# Patient Record
Sex: Male | Born: 1975 | Race: Black or African American | Hispanic: No | Marital: Married | Smoking: Never smoker
Health system: Southern US, Community
[De-identification: ages and names within clinical notes are randomized; demographics above are authoritative.]

## PROBLEM LIST (undated history)

## (undated) DIAGNOSIS — K648 Other hemorrhoids: Secondary | ICD-10-CM

## (undated) DIAGNOSIS — Z789 Other specified health status: Secondary | ICD-10-CM

## (undated) HISTORY — DX: Other hemorrhoids: K64.8

## (undated) HISTORY — PX: OTHER SURGICAL HISTORY: SHX169

## (undated) HISTORY — PX: HEMORRHOID BANDING: SHX5850

## (undated) HISTORY — PX: NO PAST SURGERIES: SHX2092

---

## 2004-05-15 ENCOUNTER — Ambulatory Visit (HOSPITAL_COMMUNITY): Admission: RE | Admit: 2004-05-15 | Discharge: 2004-05-15 | Payer: Self-pay | Admitting: Surgery

## 2008-03-08 ENCOUNTER — Emergency Department (HOSPITAL_COMMUNITY): Admission: EM | Admit: 2008-03-08 | Discharge: 2008-03-08 | Payer: Self-pay | Admitting: Emergency Medicine

## 2009-04-08 ENCOUNTER — Emergency Department (HOSPITAL_COMMUNITY): Admission: EM | Admit: 2009-04-08 | Discharge: 2009-04-08 | Payer: Self-pay | Admitting: Emergency Medicine

## 2010-01-31 ENCOUNTER — Emergency Department (HOSPITAL_COMMUNITY): Admission: EM | Admit: 2010-01-31 | Discharge: 2010-01-31 | Payer: Self-pay | Admitting: Family Medicine

## 2011-02-28 LAB — POCT RAPID STREP A (OFFICE): Streptococcus, Group A Screen (Direct): NEGATIVE

## 2011-03-18 LAB — BLOOD GAS, ARTERIAL
Acid-base deficit: 2.1 mmol/L — ABNORMAL HIGH (ref 0.0–2.0)
Patient temperature: 98.6
TCO2: 23.5 mmol/L (ref 0–100)

## 2011-03-18 LAB — DIFFERENTIAL
Basophils Absolute: 0 10*3/uL (ref 0.0–0.1)
Basophils Relative: 1 % (ref 0–1)
Eosinophils Absolute: 0 10*3/uL (ref 0.0–0.7)
Lymphocytes Relative: 22 % (ref 12–46)
Monocytes Absolute: 0.2 10*3/uL (ref 0.1–1.0)

## 2011-03-18 LAB — RAPID URINE DRUG SCREEN, HOSP PERFORMED
Amphetamines: NOT DETECTED
Barbiturates: NOT DETECTED
Benzodiazepines: NOT DETECTED
Cocaine: NOT DETECTED
Opiates: NOT DETECTED

## 2011-03-18 LAB — URINALYSIS, ROUTINE W REFLEX MICROSCOPIC
Hgb urine dipstick: NEGATIVE
Protein, ur: NEGATIVE mg/dL
Specific Gravity, Urine: 1.004 — ABNORMAL LOW (ref 1.005–1.030)
pH: 5.5 (ref 5.0–8.0)

## 2011-03-18 LAB — COMPREHENSIVE METABOLIC PANEL
Alkaline Phosphatase: 58 U/L (ref 39–117)
CO2: 22 mEq/L (ref 19–32)
Chloride: 107 mEq/L (ref 96–112)
GFR calc non Af Amer: >60 mL/min (ref >60)
Potassium: 3.4 mEq/L — ABNORMAL LOW (ref 3.5–5.1)
Total Protein: 6.3 g/dL (ref 6.0–8.3)

## 2011-03-18 LAB — CBC
MCHC: 34.2 g/dL (ref 30.0–36.0)
MCV: 89 fL (ref 78.0–100.0)
RBC: 4.4 MIL/uL (ref 4.22–5.81)
RDW: 12.4 % (ref 11.5–15.5)

## 2011-03-18 LAB — POCT CARDIAC MARKERS: Troponin i, poc: <0.05 ng/mL (ref 0.00–0.09)

## 2011-03-18 LAB — ETHANOL: Alcohol, Ethyl (B): 338 mg/dL — ABNORMAL HIGH (ref 0–10)

## 2011-04-22 NOTE — Discharge Summary (Signed)
NAME:  Phillip [name], Phillip [name]                ACCOUNT NO.:  [id number]   MEDICAL RECORD NO.:  [id number]          PATIENT TYPE:  EMS   LOCATION:  ED                           FACILITY:  [facility]   PHYSICIAN:  Pedro Earls, MD     DATE OF BIRTH:  Sep 12, 1976   DATE OF ADMISSION:  04/08/2009  DATE OF DISCHARGE:  04/08/2009                               DISCHARGE SUMMARY   DISCHARGE DIAGNOSES:  1. Unresponsiveness secondary to alcohol intoxication.  2. Atelectasis.   HOSPITAL COURSE:  Please see my H and P dictated approximately 1 hour  ago for details.  The patient apparently improved in the emergency room.  He is fully awake and wants to go home.  The patient's condition  improved.  The patient is stable to be discharged.   DISCHARGE MEDICATIONS:  None.      Pedro Earls, MD  Electronically Signed     NS/MEDQ  D:  04/08/2009  T:  04/08/2009  Job:  [id number]
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
## 2015-11-02 ENCOUNTER — Emergency Department (HOSPITAL_BASED_OUTPATIENT_CLINIC_OR_DEPARTMENT_OTHER)
Admission: EM | Admit: 2015-11-02 | Discharge: 2015-11-02 | Disposition: A | Discharge status: Home / Self Care | Payer: BC Managed Care – PPO | Attending: Emergency Medicine | Admitting: Emergency Medicine

## 2015-11-02 ENCOUNTER — Emergency Department (HOSPITAL_BASED_OUTPATIENT_CLINIC_OR_DEPARTMENT_OTHER): Payer: BC Managed Care – PPO

## 2015-11-02 ENCOUNTER — Encounter (HOSPITAL_BASED_OUTPATIENT_CLINIC_OR_DEPARTMENT_OTHER): Payer: Self-pay | Admitting: Emergency Medicine

## 2015-11-02 DIAGNOSIS — K2289 Other specified disease of esophagus: Secondary | ICD-10-CM

## 2015-11-02 DIAGNOSIS — R079 Chest pain, unspecified: Secondary | ICD-10-CM | POA: Diagnosis present

## 2015-11-02 DIAGNOSIS — K228 Other specified diseases of esophagus: Secondary | ICD-10-CM | POA: Diagnosis not present

## 2015-11-02 LAB — TROPONIN I

## 2015-11-02 MED ORDER — OMEPRAZOLE 40 MG PO CPDR: DELAYED_RELEASE_CAPSULE | ORAL | Status: DC

## 2015-11-02 MED ORDER — SUCRALFATE 1 G PO TABS: 1.0000 g | ORAL_TABLET | Freq: Three times a day (TID) | ORAL | Status: DC

## 2015-11-02 MED ORDER — SUCRALFATE 1 G PO TABS
1.0000 g | ORAL_TABLET | Freq: Once | ORAL | Status: AC
  Administered 2015-11-02: 1 g via ORAL
  Filled 2015-11-02: qty 1

## 2015-11-02 NOTE — ED Notes (Signed)
Pt c/o substernal chest pain x 4 days. Pt states pain worsens after eating and drinking. Pain is described as sharp in nature.

## 2015-11-02 NOTE — ED Notes (Signed)
Pt states he had tried several OTC "heart burn" medications without relief.

## 2015-11-02 NOTE — ED Provider Notes (Signed)
CSN: [id number]     Arrival date & time 11/02/15  0127 History   First MD Initiated Contact with Patient 11/02/15 0209     Chief Complaint  Patient presents with  . Chest Pain     (Consider location/radiation/quality/duration/timing/severity/associated sxs/prior Treatment) HPI  This is a 39 year old male with a five-day history of chest pain. The pain is located just to the left of his lower sternum. It was sharp and well localized. The pain comes and goes, but is exacerbated whenever he eats or drinks anything. Symptoms are moderate. He has an associated feeling of being bloated. He denies nausea, vomiting, diarrhea, shortness of breath or change with breathing. He has tried multiple over-the-counter medications including Zantac, Pepcid, Nexium and Prilosec without relief.  History reviewed. No pertinent past medical history. History reviewed. No pertinent past surgical history. No family history on file. Social History  Substance Use Topics  . Smoking status: Never Smoker   . Smokeless tobacco: None  . Alcohol Use: Yes    Review of Systems  All other systems reviewed and are negative.   Allergies  Review of patient's allergies indicates no known allergies.  Home Medications   Prior to Admission medications   Not on File   BP 152/88 mmHg  Pulse 58  Temp(Src) 98.7 F (37.1 C) (Oral)  Resp 16  Ht 5\' 7"  (1.702 m)  Wt 175 lb (79.379 kg)  BMI 27.40 kg/m2  SpO2 100%   Physical Exam  General: Well-developed, well-nourished male in no acute distress; appearance consistent with age of record HENT: normocephalic; atraumatic Eyes: pupils equal, round and reactive to light; extraocular muscles intact Neck: supple Heart: regular rate and rhythm Lungs: clear to auscultation bilaterally Chest: Nontender Abdomen: soft; nondistended; nontender; no masses or hepatosplenomegaly; bowel sounds present Extremities: No deformity; full range of motion; pulses normal Neurologic:  Awake, alert and oriented; motor function intact in all extremities and symmetric; no facial droop Skin: Warm and dry Psychiatric: Normal mood and affect    ED Course  Procedures (including critical care time)   EKG Interpretation   Date/Time:  Friday November 02 2015 01:41:25 EST Ventricular Rate:  72 PR Interval:  170 QRS Duration: 82 QT Interval:  368 QTC Calculation: 402 R Axis:   50 Text Interpretation:  Normal sinus rhythm Normal ECG No significant change  was found Confirmed by Florina Ou  MD, Jenny Reichmann ([id number]) on 11/02/2015 2:06:27 AM      MDM  Nursing notes and vitals signs, including pulse oximetry, reviewed.  Summary of this visit's results, reviewed by myself:  Labs:  Results for orders placed or performed during the hospital encounter of 11/02/15 (from the past 24 hour(s))  Troponin I     Status: None   Collection Time: 11/02/15  2:20 AM  Result Value Ref Range   Troponin I <0.03 <0.031 ng/mL    Imaging Studies: Dg Chest 2 View  11/02/2015  CLINICAL DATA:  Acute onset of mid to left-sided chest pain. Initial encounter. EXAM: CHEST  2 VIEW COMPARISON:  Chest radiograph performed 04/08/2009 FINDINGS: The lungs are well-aerated. Mild vascular congestion is noted. There is no evidence of focal opacification, pleural effusion or pneumothorax. The heart is normal in size; the mediastinal contour is within normal limits. No acute osseous abnormalities are seen. IMPRESSION: Mild vascular congestion noted.  Lungs remain grossly clear. Electronically Signed   By: Garald Balding M.D.   On: 11/02/2015 03:12   3:21 AM Suspect esophageal spasm or irritation given clear  exacerbation with eating or drinking. His EKG is normal as is his troponin. There is no hiatal hernia seen on his chest x-ray. We will place him on a PPI and Carafate and refer to gastroenterology.    Shanon Rosser, MD 11/02/15 [phone]

## 2018-01-09 ENCOUNTER — Encounter (HOSPITAL_COMMUNITY): Payer: Self-pay | Admitting: Emergency Medicine

## 2018-01-09 ENCOUNTER — Other Ambulatory Visit: Payer: Self-pay

## 2018-01-09 ENCOUNTER — Ambulatory Visit (HOSPITAL_COMMUNITY)
Admission: EM | Admit: 2018-01-09 | Discharge: 2018-01-09 | Disposition: A | Discharge status: Home / Self Care | Payer: BC Managed Care – PPO | Attending: Family Medicine | Admitting: Family Medicine

## 2018-01-09 ENCOUNTER — Ambulatory Visit (INDEPENDENT_AMBULATORY_CARE_PROVIDER_SITE_OTHER): Payer: BC Managed Care – PPO

## 2018-01-09 DIAGNOSIS — S82142A Displaced bicondylar fracture of left tibia, initial encounter for closed fracture: Secondary | ICD-10-CM | POA: Diagnosis not present

## 2018-01-09 DIAGNOSIS — S82102A Unspecified fracture of upper end of left tibia, initial encounter for closed fracture: Secondary | ICD-10-CM
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
## 2018-01-09 NOTE — Discharge Instructions (Signed)
You may use over the counter ibuprofen or acetaminophen as needed.   Use crutches to remain non weight bearing until you see an orthopaedist.

## 2018-01-09 NOTE — ED Provider Notes (Signed)
  [facility]   [id number] 01/09/18 Arrival Time: 3976  ASSESSMENT & PLAN:  1. Tibial plateau fracture, left, closed, initial encounter    Imaging: Dg Knee Complete 4 Views Left  Result Date: 01/09/2018 CLINICAL DATA:  Left knee pain following direct trauma yesterday. EXAM: LEFT KNEE - COMPLETE 4+ VIEW COMPARISON:  None. FINDINGS: There is an avulsion fracture off the lateral aspect of the lateral tibial plateau. No intra-articular involvement. There is an associated moderate-sized effusion and diffuse subcutaneous edema. IMPRESSION: 1. Avulsion fracture off the lateral aspect of the lateral tibial plateau. 2. Moderate-sized effusion. Electronically Signed   By: Claudie Revering M.D.   On: 01/09/2018 15:13   Crutches to remain NWB until he sees an orthopaedist. We do not have a hinged knee brace to give him. OTC analgesics as needed. Work note given. May f/u here as needed.  Reviewed expectations re: course of current medical issues. Questions answered. Outlined signs and symptoms indicating need for more acute intervention. Patient verbalized understanding. After Visit Summary given.  SUBJECTIVE: History from: patient. Phillip [name] is a 42 y.o. male who reports persistent pain of his left lateral knee with swelling. Onset abrupt beginning 1 day ago. Injury/trama: yes, reports being kicked in L knee while playing basketball. Discomfort described as aching and stabbing without radiation. Extremity sensation changes or weakness: none. Self treatment: ibuprofen with mild help. Bending knee worsens discomfort. No locking or giving out. Able to bear weight but with pain.  ROS: As per HPI.   OBJECTIVE:  Vitals:   01/09/18 1421  BP: 135/74  Pulse: 77  Resp: 18  Temp: 98.2 F (36.8 C)  SpO2: 100%    General appearance: alert; no distress Extremities: no cyanosis or edema; symmetrical with no gross deformities; tenderness over his left lateral knee with moderate swelling and  no bruising; ROM: normal but with report pain CV: normal extremity capillary refill Skin: warm and dry Neurologic: normal gait; normal symmetric reflexes in all extremities; normal sensation in all extremities Psychological: alert and cooperative; normal mood and affect  No Known Allergies   Social History   Socioeconomic History  . Marital status: Married    Spouse name: Not on file  . Number of children: Not on file  . Years of education: Not on file  . Highest education level: Not on file  Social Needs  . Financial resource strain: Not on file  . Food insecurity - worry: Not on file  . Food insecurity - inability: Not on file  . Transportation needs - medical: Not on file  . Transportation needs - non-medical: Not on file  Occupational History  . Not on file  Tobacco Use  . Smoking status: Never Smoker  Substance and Sexual Activity  . Alcohol use: Yes  . Drug use: Not on file  . Sexual activity: Not on file  Other Topics Concern  . Not on file  Social History Narrative  . Not on file     Vanessa Kick, MD 01/11/18 1004

## 2018-01-11 ENCOUNTER — Encounter (INDEPENDENT_AMBULATORY_CARE_PROVIDER_SITE_OTHER): Payer: Self-pay | Admitting: Orthopaedic Surgery

## 2018-01-11 ENCOUNTER — Ambulatory Visit (INDEPENDENT_AMBULATORY_CARE_PROVIDER_SITE_OTHER): Payer: BC Managed Care – PPO | Admitting: Orthopaedic Surgery

## 2018-01-11 DIAGNOSIS — S82142A Displaced bicondylar fracture of left tibia, initial encounter for closed fracture: Secondary | ICD-10-CM | POA: Diagnosis not present
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
## 2018-01-22 ENCOUNTER — Ambulatory Visit
Admission: RE | Admit: 2018-01-22 | Discharge: 2018-01-22 | Disposition: A | Discharge status: Home / Self Care | Payer: BC Managed Care – PPO | Source: Ambulatory Visit | Attending: Orthopaedic Surgery | Admitting: Orthopaedic Surgery

## 2018-01-22 DIAGNOSIS — S82142A Displaced bicondylar fracture of left tibia, initial encounter for closed fracture: Secondary | ICD-10-CM

## 2018-01-26 ENCOUNTER — Telehealth (INDEPENDENT_AMBULATORY_CARE_PROVIDER_SITE_OTHER): Payer: Self-pay | Admitting: Physician Assistant

## 2018-01-26 ENCOUNTER — Ambulatory Visit (INDEPENDENT_AMBULATORY_CARE_PROVIDER_SITE_OTHER): Payer: BC Managed Care – PPO | Admitting: Physician Assistant

## 2018-01-26 ENCOUNTER — Encounter (INDEPENDENT_AMBULATORY_CARE_PROVIDER_SITE_OTHER): Payer: Self-pay | Admitting: Physician Assistant

## 2018-01-26 DIAGNOSIS — S83242A Other tear of medial meniscus, current injury, left knee, initial encounter: Secondary | ICD-10-CM | POA: Insufficient documentation

## 2018-01-26 DIAGNOSIS — S83512A Sprain of anterior cruciate ligament of left knee, initial encounter: Secondary | ICD-10-CM | POA: Insufficient documentation

## 2018-01-26 DIAGNOSIS — S82142D Displaced bicondylar fracture of left tibia, subsequent encounter for closed fracture with routine healing: Secondary | ICD-10-CM

## 2018-01-26 DIAGNOSIS — S83242D Other tear of medial meniscus, current injury, left knee, subsequent encounter: Secondary | ICD-10-CM | POA: Diagnosis not present

## 2018-01-26 DIAGNOSIS — S83512D Sprain of anterior cruciate ligament of left knee, subsequent encounter: Secondary | ICD-10-CM | POA: Diagnosis not present

## 2018-01-26 NOTE — Telephone Encounter (Signed)
Will you look at this guys MRI?  I saw him today.  Will keep him wbat in knee immobilizer and refer to Dr. Marlou Sa.  Want to make sure you are ok with all of this?

## 2018-01-26 NOTE — Progress Notes (Signed)
Phillip [name] comes in for follow-up.  13 days out from left knee injury, date of injury 01/09/2018.  We saw him in our clinic on 01/11/2018 and treated him for a Segond fracture to the left knee.  At that point, it was noticeable that he was ACL deficient.  an MRI was ordered.  He has been weightbearing as tolerated in a knee immobilizer.  Minimal pain, just some discomfort from wearing the knee immobilizer.  He comes in today to discuss MRI results of the left knee.  MRI from 01/23/2018 reveals a complete tear of the ACL with a complex tear of the medial meniscus.  Also noted is a grade 1 MCL injury, a small avulsion fracture along the lateral aspect of the lateral tibial plateau (segond fracture), as well as a nondisplaced impaction fracture medial tibial plateau.  At this point we are going to have Phillip [name] continue in his knee immobilizer.  We we will refer him to Dr. Marlou Sa for further care which will include ACL reconstruction.  He will call with concerns or questions in the meantime.

## 2018-01-27 NOTE — Telephone Encounter (Signed)
Yes - I did    Thanks

## 2018-01-27 NOTE — Telephone Encounter (Signed)
Ok, thanks.

## 2018-01-29 ENCOUNTER — Ambulatory Visit (INDEPENDENT_AMBULATORY_CARE_PROVIDER_SITE_OTHER): Payer: BC Managed Care – PPO | Admitting: Orthopaedic Surgery

## 2018-02-02 ENCOUNTER — Telehealth (INDEPENDENT_AMBULATORY_CARE_PROVIDER_SITE_OTHER): Payer: Self-pay | Admitting: Orthopedic Surgery

## 2018-02-02 NOTE — Telephone Encounter (Signed)
Tried calling. No answer. VM is full. Will try to call again later. Ok to schedule earlier.

## 2018-02-02 NOTE — Telephone Encounter (Signed)
Patient called stated that he has several fractures and not sure if they have to heal before he has surgery.  Please call patient to advise

## 2018-02-02 NOTE — Telephone Encounter (Signed)
Fxs ok I reviewed scan - shouldn't delay surgery - ok to come in earlier if he desires - might be good to do to get him on schedule earlier

## 2018-02-02 NOTE — Telephone Encounter (Signed)
Patient has an appointment with you on 02/08/18. Dr Erlinda Hong referred. Please advise on question that he has about the fractures and his injury. Do you want me to get him in with you sooner?

## 2018-02-03 NOTE — Telephone Encounter (Signed)
Patient wanted to wait until he can speak with you first about scheduling appt due to the high copay, best number to reach him at is the mobile number. He will clear off the voicemail to be sure you can reach him. # [phone]

## 2018-02-03 NOTE — Telephone Encounter (Signed)
Tried calling again, no answer

## 2018-02-03 NOTE — Telephone Encounter (Signed)
Patient referred to you by Dr Erlinda Hong. He states that he has a $100 copay and cannot afford this just to come in to talk to you for surgical consult. Please advise. Thanks.

## 2018-02-04 NOTE — Telephone Encounter (Signed)
I called Phillip [name].  He is doing okay.  Does not have the leg fully straight yet and cannot bend it past 90 degrees.  In order to minimize cost I recommended that he work on achieving better range of motion including full extension and flexion past 90 degrees before we consider surgery.  Discussed with him about ACL reconstruction and partial medial meniscectomy.  Plan for him to call me in 3-4 weeks so we can talk about how his knee range of motion is going.  Because he wants to minimize cost I discussed with him exercises to work on achieving full extension primarily.

## 2018-02-08 ENCOUNTER — Ambulatory Visit (INDEPENDENT_AMBULATORY_CARE_PROVIDER_SITE_OTHER): Payer: BC Managed Care – PPO | Admitting: Orthopedic Surgery

## 2018-03-01 ENCOUNTER — Ambulatory Visit (INDEPENDENT_AMBULATORY_CARE_PROVIDER_SITE_OTHER): Payer: BC Managed Care – PPO | Admitting: Orthopedic Surgery

## 2018-03-05 ENCOUNTER — Encounter (INDEPENDENT_AMBULATORY_CARE_PROVIDER_SITE_OTHER): Payer: Self-pay | Admitting: Orthopedic Surgery

## 2018-03-05 ENCOUNTER — Ambulatory Visit (INDEPENDENT_AMBULATORY_CARE_PROVIDER_SITE_OTHER): Payer: BC Managed Care – PPO | Admitting: Orthopedic Surgery

## 2018-03-05 DIAGNOSIS — S83512D Sprain of anterior cruciate ligament of left knee, subsequent encounter: Secondary | ICD-10-CM

## 2018-03-06 ENCOUNTER — Encounter (INDEPENDENT_AMBULATORY_CARE_PROVIDER_SITE_OTHER): Payer: Self-pay | Admitting: Orthopedic Surgery

## 2018-03-06 NOTE — Progress Notes (Signed)
   Post-Op Visit Note   Patient: Phillip [name]           Date of Birth: 1976-10-07           MRN: [id number] Visit Date: 03/05/2018 PCP: Patient, No Pcp Per   Assessment & Plan:  Chief Complaint:  Chief Complaint  Patient presents with  . Left Knee - Pain, Follow-up   Visit Diagnoses:  1. Rupture of anterior cruciate ligament of left knee, subsequent encounter     Plan: Phillip [name] is a patient who injured his left knee 2-19.  He states that his knee is feeling a little better.  Has some difficulty bending past 90 as well as with stooping and squatting.  He does report occasional symptomatic instability.  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  He likes to play basketball and does running and baseball.  No personal or family history of DVT or pulmonary embolism.  He is married.  Does have stairs at home.  MRI scan is reviewed and it shows a small Segond fracture off the tibial plateau as well as ACL tear and medial meniscal tear.  On examination the patient lacks about 5 degrees of full extension on the left knee.  Does have instability to Lachman and anterior drawer.  MCL and LCL feel stable.  No posterior lateral rotatory instability and side to side difference testing left versus right.  No other masses lymphadenopathy or skin changes noted in that left tibial plateau region pedal pulses palpable and ankle dorsiflexion intact..  Impression is left ACL tear and medial meniscal tear without any evidence of lateral sided ligamentous instability.  Plan is ACL reconstruction with hamstring autograft and medial meniscal resection and/or repair.  Risk benefits are discussed with the patient including but not limited to infection nerve vessel damage knee stiffness prolonged rehabilitation.  All questions answered.  Follow-Up Instructions: No follow-ups on file.   Orders:  No orders of the defined types were placed in this encounter.  No orders of the defined types were placed in  this encounter.   Imaging: No results found.  PMFS History: Patient Active Problem List   Diagnosis Date Noted  . Left anterior cruciate ligament tear 01/26/2018  . Tear of medial meniscus of left knee, current 01/26/2018  . Closed fracture of left tibial plateau 01/11/2018   History reviewed. No pertinent past medical history.  History reviewed. No pertinent family history.  History reviewed. No pertinent surgical history. Social History   Occupational History  . Not on file  Tobacco Use  . Smoking status: Never Smoker  . Smokeless tobacco: Never Used  Substance and Sexual Activity  . Alcohol use: Yes  . Drug use: Not on file  . Sexual activity: Not on file

## 2018-03-12 ENCOUNTER — Other Ambulatory Visit (INDEPENDENT_AMBULATORY_CARE_PROVIDER_SITE_OTHER): Payer: Self-pay | Admitting: Orthopedic Surgery

## 2018-03-12 DIAGNOSIS — S83512A Sprain of anterior cruciate ligament of left knee, initial encounter: Principal | ICD-10-CM

## 2018-03-12 DIAGNOSIS — S83207A Unspecified tear of unspecified meniscus, current injury, left knee, initial encounter: Secondary | ICD-10-CM

## 2018-03-19 ENCOUNTER — Encounter (HOSPITAL_COMMUNITY)
Admission: RE | Admit: 2018-03-19 | Discharge: 2018-03-19 | Disposition: A | Discharge status: Home / Self Care | Payer: BC Managed Care – PPO | Source: Ambulatory Visit | Attending: Orthopedic Surgery | Admitting: Orthopedic Surgery

## 2018-03-19 ENCOUNTER — Encounter (HOSPITAL_COMMUNITY): Payer: Self-pay

## 2018-03-19 DIAGNOSIS — X58XXXA Exposure to other specified factors, initial encounter: Secondary | ICD-10-CM | POA: Insufficient documentation

## 2018-03-19 DIAGNOSIS — S83242A Other tear of medial meniscus, current injury, left knee, initial encounter: Secondary | ICD-10-CM | POA: Insufficient documentation

## 2018-03-19 DIAGNOSIS — Z01812 Encounter for preprocedural laboratory examination: Secondary | ICD-10-CM | POA: Insufficient documentation

## 2018-03-19 DIAGNOSIS — S83512A Sprain of anterior cruciate ligament of left knee, initial encounter: Secondary | ICD-10-CM | POA: Diagnosis not present

## 2018-03-19 HISTORY — DX: Other specified health status: Z78.9

## 2018-03-19 LAB — BASIC METABOLIC PANEL
Anion gap: 10 (ref 5–15)
BUN: 14 mg/dL (ref 6–20)
CHLORIDE: 106 mmol/L (ref 101–111)
CO2: 23 mmol/L (ref 22–32)
CREATININE: 1.39 mg/dL — AB (ref 0.61–1.24)
Calcium: 9.3 mg/dL (ref 8.9–10.3)
GFR calc Af Amer: >60 mL/min (ref >60)
GFR calc non Af Amer: >60 mL/min (ref >60)
GLUCOSE: 98 mg/dL (ref 65–99)
Potassium: 4.1 mmol/L (ref 3.5–5.1)
SODIUM: 139 mmol/L (ref 135–145)

## 2018-03-19 LAB — CBC
HCT: 41.6 % (ref 39.0–52.0)
Hemoglobin: 14.2 g/dL (ref 13.0–17.0)
MCH: 29.5 pg (ref 26.0–34.0)
MCHC: 34.1 g/dL (ref 30.0–36.0)
MCV: 86.3 fL (ref 78.0–100.0)
Platelets: 288 10*3/uL (ref 150–400)
RBC: 4.82 MIL/uL (ref 4.22–5.81)
RDW: 12.6 % (ref 11.5–15.5)
WBC: 5.8 10*3/uL (ref 4.0–10.5)

## 2018-03-29 ENCOUNTER — Encounter (HOSPITAL_COMMUNITY): Payer: Self-pay | Admitting: Anesthesiology

## 2018-03-29 MED ORDER — CEFAZOLIN SODIUM-DEXTROSE 2-4 GM/100ML-% IV SOLN
2.0000 g | INTRAVENOUS | Status: AC
  Administered 2018-03-30: 2 g via INTRAVENOUS
  Filled 2018-03-29: qty 100

## 2018-03-29 NOTE — H&P (Signed)
Phillip [name] is an 42 y.o. male.   Chief Complaint: Left knee pain and instability Phillip [name] is a 42 year old patient with left knee pain and instability. HPI: He injured his knee in February 2019.  MRI scan demonstrated ACL tear medial meniscal tear and small Segond fracture.  Subsequent examination demonstrates ACL instability but no collateral ligament instability or posterior lateral rotatory instability.  Patient is very active in sports.  He presents now for operative management after explanation of risks and benefits.  No family history or personal history of DVT or pulmonary embolism.  Past Medical History:  Diagnosis Date  . Medical history non-contributory     Past Surgical History:  Procedure Laterality Date  . NO PAST SURGERIES      No family history on file. Social History:  reports that he has never smoked. He has never used smokeless tobacco. He reports that he drinks alcohol. His drug history is not on file.  Allergies: No Known Allergies  No medications prior to admission.    No results found for this or any previous visit (from the past 48 hour(s)). No results found.  Review of Systems  Musculoskeletal: Positive for joint pain.  All other systems reviewed and are negative.   There were no vitals taken for this visit. Physical Exam  Constitutional: He appears well-developed.  HENT:  Head: Normocephalic.  Eyes: Pupils are equal, round, and reactive to light.  Neck: Normal range of motion.  Cardiovascular: Normal rate.  Respiratory: Effort normal.  Neurological: He is alert.  Skin: Skin is warm.  Psychiatric: He has a normal mood and affect.  Examination of the left knee demonstrates flexion past 90 with near full extension.  ACL laxity is present.  Collateral stable.  No posterior lateral rotatory instability noted.  Pedal pulses palpable.  Ankle dorsiflexion plantarflexion intact.  Assessment/Plan Impression is left knee ACL tear and medial meniscal  tear.  That medial meniscal tear may or may not be repairable.  Looks like it is in the red-white zone with a horizontal cleavage component.  Risks and benefits of surgical intervention are discussed.  They include but are not limited to infection nerve vessel damage knee stiffness as well as incomplete healing and potential for further surgery.  Patient understands the risk and benefits and wishes to proceed.  I think hamstring autograft would give him good fixation and the least amount of donor site morbidity.  [name], MD 03/29/2018, 10:28 AM

## 2018-03-29 NOTE — Anesthesia Preprocedure Evaluation (Addendum)
Anesthesia Evaluation  Patient identified by MRN, date of birth, ID band Patient awake    Reviewed: Allergy & Precautions, NPO status , Patient's Chart, lab work & pertinent test results  Airway Mallampati: II       Dental no notable dental hx. (+) Teeth Intact   Pulmonary neg pulmonary ROS,    Pulmonary exam normal breath sounds clear to auscultation       Cardiovascular negative cardio ROS Normal cardiovascular exam Rhythm:Regular Rate:Normal     Neuro/Psych negative neurological ROS  negative psych ROS   GI/Hepatic negative GI ROS, Neg liver ROS,   Endo/Other  negative endocrine ROS  Renal/GU negative Renal ROS  negative genitourinary   Musculoskeletal negative musculoskeletal ROS (+)   Abdominal Normal abdominal exam  (+)   Peds  Hematology negative hematology ROS (+)   Anesthesia Other Findings   Reproductive/Obstetrics                            Anesthesia Physical Anesthesia Plan  ASA: I  Anesthesia Plan: General   Post-op Pain Management:  Regional for Post-op pain   Induction: Intravenous  PONV Risk Score and Plan: 1 and Ondansetron and Dexamethasone  Airway Management Planned: Oral ETT  Additional Equipment:   Intra-op Plan:   Post-operative Plan: Extubation in OR  Informed Consent: I have reviewed the patients History and Physical, chart, labs and discussed the procedure including the risks, benefits and alternatives for the proposed anesthesia with the patient or authorized representative who has indicated his/her understanding and acceptance.   Dental advisory given  Plan Discussed with: CRNA and Surgeon  Anesthesia Plan Comments:        Anesthesia Quick Evaluation

## 2018-03-30 ENCOUNTER — Encounter (INDEPENDENT_AMBULATORY_CARE_PROVIDER_SITE_OTHER): Payer: Self-pay | Admitting: Orthopedic Surgery

## 2018-03-30 ENCOUNTER — Ambulatory Visit (HOSPITAL_COMMUNITY): Payer: BC Managed Care – PPO | Admitting: Anesthesiology

## 2018-03-30 ENCOUNTER — Telehealth (INDEPENDENT_AMBULATORY_CARE_PROVIDER_SITE_OTHER): Payer: Self-pay | Admitting: Orthopedic Surgery

## 2018-03-30 ENCOUNTER — Ambulatory Visit (HOSPITAL_COMMUNITY)
Admission: RE | Admit: 2018-03-30 | Discharge: 2018-03-30 | Disposition: A | Discharge status: Home / Self Care | Payer: BC Managed Care – PPO | Source: Ambulatory Visit | Attending: Orthopedic Surgery | Admitting: Orthopedic Surgery

## 2018-03-30 ENCOUNTER — Other Ambulatory Visit: Payer: Self-pay

## 2018-03-30 ENCOUNTER — Encounter (HOSPITAL_COMMUNITY): Payer: Self-pay | Admitting: *Deleted

## 2018-03-30 ENCOUNTER — Encounter (HOSPITAL_COMMUNITY)
Admission: RE | Disposition: A | Discharge status: Home / Self Care | Payer: Self-pay | Source: Ambulatory Visit | Attending: Orthopedic Surgery

## 2018-03-30 DIAGNOSIS — S83242A Other tear of medial meniscus, current injury, left knee, initial encounter: Secondary | ICD-10-CM | POA: Insufficient documentation

## 2018-03-30 DIAGNOSIS — X58XXXA Exposure to other specified factors, initial encounter: Secondary | ICD-10-CM | POA: Diagnosis not present

## 2018-03-30 DIAGNOSIS — S83242D Other tear of medial meniscus, current injury, left knee, subsequent encounter: Secondary | ICD-10-CM | POA: Diagnosis not present

## 2018-03-30 DIAGNOSIS — S83512D Sprain of anterior cruciate ligament of left knee, subsequent encounter: Secondary | ICD-10-CM | POA: Diagnosis not present

## 2018-03-30 DIAGNOSIS — S83207A Unspecified tear of unspecified meniscus, current injury, left knee, initial encounter: Secondary | ICD-10-CM

## 2018-03-30 DIAGNOSIS — M2242 Chondromalacia patellae, left knee: Secondary | ICD-10-CM | POA: Insufficient documentation

## 2018-03-30 DIAGNOSIS — S83512A Sprain of anterior cruciate ligament of left knee, initial encounter: Secondary | ICD-10-CM | POA: Diagnosis not present

## 2018-03-30 HISTORY — PX: ANTERIOR CRUCIATE LIGAMENT REPAIR: SHX115

## 2018-03-30 SURGERY — RECONSTRUCTION, KNEE, ACL, USING HAMSTRING GRAFT
Anesthesia: Regional | Site: Knee | Laterality: Left

## 2018-03-30 MED ORDER — 0.9 % SODIUM CHLORIDE (POUR BTL) OPTIME
TOPICAL | Status: DC | PRN
  Administered 2018-03-30: 1000 mL

## 2018-03-30 MED ORDER — ROCURONIUM BROMIDE 100 MG/10ML IV SOLN
INTRAVENOUS | Status: DC | PRN
  Administered 2018-03-30: 50 mg via INTRAVENOUS

## 2018-03-30 MED ORDER — SUGAMMADEX SODIUM 200 MG/2ML IV SOLN
INTRAVENOUS | Status: AC
Start: 2018-03-30 — End: ?
  Filled 2018-03-30: qty 2

## 2018-03-30 MED ORDER — PROPOFOL 10 MG/ML IV BOLUS
INTRAVENOUS | Status: AC
  Filled 2018-03-30: qty 40

## 2018-03-30 MED ORDER — SODIUM CHLORIDE 0.9 % IR SOLN
Status: DC | PRN
  Administered 2018-03-30: 3000 mL

## 2018-03-30 MED ORDER — CHLORHEXIDINE GLUCONATE 4 % EX LIQD: 60.0000 mL | Freq: Once | CUTANEOUS | Status: DC

## 2018-03-30 MED ORDER — LIDOCAINE HCL (CARDIAC) PF 100 MG/5ML IV SOSY
PREFILLED_SYRINGE | INTRAVENOUS | Status: DC | PRN
  Administered 2018-03-30: 40 mg via INTRAVENOUS
  Administered 2018-03-30: 60 mg via INTRAVENOUS

## 2018-03-30 MED ORDER — KETOROLAC TROMETHAMINE 15 MG/ML IJ SOLN
INTRAMUSCULAR | Status: AC
  Filled 2018-03-30: qty 1

## 2018-03-30 MED ORDER — SODIUM CHLORIDE 0.9 % IR SOLN
Status: DC | PRN
  Administered 2018-03-30: 1 mL

## 2018-03-30 MED ORDER — MIDAZOLAM HCL 2 MG/2ML IJ SOLN
INTRAMUSCULAR | Status: AC
  Filled 2018-03-30: qty 2

## 2018-03-30 MED ORDER — BUPIVACAINE-EPINEPHRINE 0.25% -1:200000 IJ SOLN
INTRAMUSCULAR | Status: DC | PRN
  Administered 2018-03-30: 30 mL

## 2018-03-30 MED ORDER — MORPHINE SULFATE (PF) 4 MG/ML IV SOLN
INTRAVENOUS | Status: AC
  Filled 2018-03-30: qty 2

## 2018-03-30 MED ORDER — HYDROMORPHONE HCL 2 MG/ML IJ SOLN
INTRAMUSCULAR | Status: AC
  Filled 2018-03-30: qty 1

## 2018-03-30 MED ORDER — BUPIVACAINE-EPINEPHRINE (PF) 0.25% -1:200000 IJ SOLN
INTRAMUSCULAR | Status: AC
  Filled 2018-03-30: qty 30

## 2018-03-30 MED ORDER — EPINEPHRINE PF 1 MG/ML IJ SOLN
INTRAMUSCULAR | Status: AC
  Filled 2018-03-30: qty 4

## 2018-03-30 MED ORDER — CLONIDINE HCL (ANALGESIA) 100 MCG/ML EP SOLN
150.0000 ug | Freq: Once | EPIDURAL | Status: DC
  Filled 2018-03-30: qty 10

## 2018-03-30 MED ORDER — MEPERIDINE HCL 50 MG/ML IJ SOLN: 6.2500 mg | INTRAMUSCULAR | Status: DC | PRN

## 2018-03-30 MED ORDER — MORPHINE SULFATE (PF) 4 MG/ML IV SOLN
INTRAVENOUS | Status: DC | PRN
  Administered 2018-03-30: 8 mg via SUBCUTANEOUS

## 2018-03-30 MED ORDER — SUGAMMADEX SODIUM 200 MG/2ML IV SOLN
INTRAVENOUS | Status: DC | PRN
  Administered 2018-03-30: 200 mg via INTRAVENOUS

## 2018-03-30 MED ORDER — FENTANYL CITRATE (PF) 100 MCG/2ML IJ SOLN
INTRAMUSCULAR | Status: DC | PRN
  Administered 2018-03-30 (×3): 50 ug via INTRAVENOUS

## 2018-03-30 MED ORDER — PROPOFOL 10 MG/ML IV BOLUS
INTRAVENOUS | Status: DC | PRN
  Administered 2018-03-30: 150 mg via INTRAVENOUS

## 2018-03-30 MED ORDER — MIDAZOLAM HCL 5 MG/5ML IJ SOLN
INTRAMUSCULAR | Status: DC | PRN
  Administered 2018-03-30 (×2): 1 mg via INTRAVENOUS

## 2018-03-30 MED ORDER — PROMETHAZINE HCL 25 MG/ML IJ SOLN: 6.2500 mg | INTRAMUSCULAR | Status: DC | PRN

## 2018-03-30 MED ORDER — KETOROLAC TROMETHAMINE 30 MG/ML IJ SOLN
INTRAMUSCULAR | Status: AC
  Filled 2018-03-30: qty 1

## 2018-03-30 MED ORDER — LIDOCAINE 2% (20 MG/ML) 5 ML SYRINGE
INTRAMUSCULAR | Status: AC
  Filled 2018-03-30: qty 10

## 2018-03-30 MED ORDER — CLONIDINE HCL (ANALGESIA) 100 MCG/ML EP SOLN
EPIDURAL | Status: DC | PRN
  Administered 2018-03-30: 1 mL via INTRA_ARTICULAR

## 2018-03-30 MED ORDER — FENTANYL CITRATE (PF) 100 MCG/2ML IJ SOLN
INTRAMUSCULAR | Status: AC
  Filled 2018-03-30: qty 2

## 2018-03-30 MED ORDER — KETOROLAC TROMETHAMINE 30 MG/ML IJ SOLN
30.0000 mg | Freq: Once | INTRAMUSCULAR | Status: AC | PRN
  Administered 2018-03-30: 30 mg via INTRAVENOUS

## 2018-03-30 MED ORDER — HYDROMORPHONE HCL 2 MG/ML IJ SOLN
0.2500 mg | INTRAMUSCULAR | Status: DC | PRN
  Administered 2018-03-30: 0.5 mg via INTRAVENOUS

## 2018-03-30 MED ORDER — ONDANSETRON HCL 4 MG/2ML IJ SOLN
INTRAMUSCULAR | Status: DC | PRN
  Administered 2018-03-30: 4 mg via INTRAVENOUS

## 2018-03-30 MED ORDER — BUPIVACAINE HCL (PF) 0.25 % IJ SOLN
INTRAMUSCULAR | Status: AC
  Filled 2018-03-30: qty 30

## 2018-03-30 MED ORDER — FENTANYL CITRATE (PF) 250 MCG/5ML IJ SOLN
INTRAMUSCULAR | Status: AC
  Filled 2018-03-30: qty 5

## 2018-03-30 MED ORDER — BUPIVACAINE HCL 0.25 % IJ SOLN
INTRAMUSCULAR | Status: DC | PRN
  Administered 2018-03-30: 30 mL

## 2018-03-30 MED ORDER — LACTATED RINGERS IV SOLN
INTRAVENOUS | Status: DC | PRN
  Administered 2018-03-30: 07:00:00 via INTRAVENOUS

## 2018-03-30 SURGICAL SUPPLY — 94 items
ALCOHOL 70% 16 OZ (MISCELLANEOUS) ×2 IMPLANT
BANDAGE ESMARK 6X9 LF (GAUZE/BANDAGES/DRESSINGS) IMPLANT
BLADE CUTTER GATOR 3.5 (BLADE) IMPLANT
BLADE GREAT WHITE 4.2 (BLADE) ×2 IMPLANT
BLADE SURG 10 STRL SS (BLADE) ×2 IMPLANT
BLADE SURG 15 STRL LF DISP TIS (BLADE) ×2 IMPLANT
BLADE SURG 15 STRL SS (BLADE) ×4
BNDG CMPR 9X6 STRL LF SNTH (GAUZE/BANDAGES/DRESSINGS)
BNDG CMPR MED 15X6 ELC VLCR LF (GAUZE/BANDAGES/DRESSINGS) ×1
BNDG ELASTIC 6X15 VLCR STRL LF (GAUZE/BANDAGES/DRESSINGS) ×2 IMPLANT
BNDG ESMARK 6X9 LF (GAUZE/BANDAGES/DRESSINGS)
BUR OVAL 6.0 (BURR) ×2 IMPLANT
COVER MAYO STAND STRL (DRAPES) ×2 IMPLANT
COVER SURGICAL LIGHT HANDLE (MISCELLANEOUS) ×2 IMPLANT
CUFF TOURNIQUET SINGLE 34IN LL (TOURNIQUET CUFF) IMPLANT
CUFF TOURNIQUET SINGLE 44IN (TOURNIQUET CUFF) IMPLANT
DECANTER SPIKE VIAL GLASS SM (MISCELLANEOUS) ×4 IMPLANT
DRAPE ARTHROSCOPY W/POUCH 114 (DRAPES) ×2 IMPLANT
DRAPE INCISE IOBAN 66X45 STRL (DRAPES) ×2 IMPLANT
DRAPE OEC MINIVIEW 54X84 (DRAPES) ×1 IMPLANT
DRAPE ORTHO SPLIT 77X108 STRL (DRAPES) ×2
DRAPE SURG ORHT 6 SPLT 77X108 (DRAPES) ×1 IMPLANT
DRAPE U-SHAPE 47X51 STRL (DRAPES) ×2 IMPLANT
DRILL FLIPCUTTER II 7.5MM (MISCELLANEOUS) IMPLANT
DRILL FLIPCUTTER II 8.0MM (INSTRUMENTS) IMPLANT
DRILL FLIPCUTTER II 8.5MM (INSTRUMENTS) IMPLANT
DRILL FLIPCUTTER II 9.0MM (INSTRUMENTS) IMPLANT
DRSG PAD ABDOMINAL 8X10 ST (GAUZE/BANDAGES/DRESSINGS) ×2 IMPLANT
DRSG TEGADERM 4X4.75 (GAUZE/BANDAGES/DRESSINGS) ×8 IMPLANT
DURAPREP 26ML APPLICATOR (WOUND CARE) ×4 IMPLANT
ELECT REM PT RETURN 9FT ADLT (ELECTROSURGICAL) ×2
ELECTRODE REM PT RTRN 9FT ADLT (ELECTROSURGICAL) ×1 IMPLANT
FLIPCUTTER II 7.5MM (MISCELLANEOUS)
FLIPCUTTER II 8.0MM (INSTRUMENTS)
FLIPCUTTER II 8.5MM (INSTRUMENTS)
FLIPCUTTER II 9.0MM (INSTRUMENTS)
GAUZE SPONGE 4X4 12PLY STRL (GAUZE/BANDAGES/DRESSINGS) ×1 IMPLANT
GAUZE XEROFORM 1X8 LF (GAUZE/BANDAGES/DRESSINGS) ×3 IMPLANT
GLOVE BIOGEL PI IND STRL 8 (GLOVE) ×1 IMPLANT
GLOVE BIOGEL PI INDICATOR 8 (GLOVE) ×1
GLOVE ORTHO TXT STRL SZ7.5 (GLOVE) ×2 IMPLANT
GLOVE SURG ORTHO 8.0 STRL STRW (GLOVE) ×2 IMPLANT
GOWN STRL REUS W/ TWL LRG LVL3 (GOWN DISPOSABLE) ×3 IMPLANT
GOWN STRL REUS W/TWL LRG LVL3 (GOWN DISPOSABLE) ×6
IMMOBILIZER KNEE 22 UNIV (SOFTGOODS) ×1 IMPLANT
KIT BASIN OR (CUSTOM PROCEDURE TRAY) ×2 IMPLANT
KIT BIOCARTILAGE DEL W/SYRINGE (KITS) IMPLANT
KIT BIOCARTILAGE LG JOINT MIX (KITS) ×3 IMPLANT
KIT TURNOVER KIT B (KITS) ×2 IMPLANT
MANIFOLD NEPTUNE II (INSTRUMENTS) ×2 IMPLANT
NDL 18GX1X1/2 (RX/OR ONLY) (NEEDLE) ×1 IMPLANT
NDL HYPO 18GX1.5 BLUNT FILL (NEEDLE) ×1 IMPLANT
NDL SUT 6 .5 CRC .975X.05 MAYO (NEEDLE) IMPLANT
NEEDLE 18GX1X1/2 (RX/OR ONLY) (NEEDLE) ×8 IMPLANT
NEEDLE HYPO 18GX1.5 BLUNT FILL (NEEDLE) ×2 IMPLANT
NEEDLE MAYO TAPER (NEEDLE) ×2
NS IRRIG 1000ML POUR BTL (IV SOLUTION) ×2 IMPLANT
PACK ARTHROSCOPY DSU (CUSTOM PROCEDURE TRAY) ×2 IMPLANT
PAD ARMBOARD 7.5X6 YLW CONV (MISCELLANEOUS) ×4 IMPLANT
PAD CAST 4YDX4 CTTN HI CHSV (CAST SUPPLIES) ×1 IMPLANT
PADDING CAST COTTON 4X4 STRL (CAST SUPPLIES) ×2
PADDING CAST COTTON 6X4 STRL (CAST SUPPLIES) ×3 IMPLANT
PEN SKIN MARKING BROAD (MISCELLANEOUS) ×1 IMPLANT
PENCIL BUTTON HOLSTER BLD 10FT (ELECTRODE) ×2 IMPLANT
PK GRAFTLINK AUTO IMPLANT SYST (Anchor) ×2 IMPLANT
PUTTY DBM ALLOSYNC PURE 5CC (Putty) ×1 IMPLANT
SET ARTHROSCOPY TUBING (MISCELLANEOUS) ×2
SET ARTHROSCOPY TUBING LN (MISCELLANEOUS) ×1 IMPLANT
SPONGE GAUZE 4X4 12PLY STER LF (GAUZE/BANDAGES/DRESSINGS) ×2 IMPLANT
SPONGE LAP 18X18 X RAY DECT (DISPOSABLE) ×3 IMPLANT
SPONGE LAP 4X18 X RAY DECT (DISPOSABLE) ×5 IMPLANT
STRIP CLOSURE SKIN 1/2X4 (GAUZE/BANDAGES/DRESSINGS) ×2 IMPLANT
SUCTION FRAZIER HANDLE 10FR (MISCELLANEOUS) ×1
SUCTION TUBE FRAZIER 10FR DISP (MISCELLANEOUS) ×1 IMPLANT
SUT ETHILON 3 0 PS 1 (SUTURE) ×5 IMPLANT
SUT MNCRL AB 3-0 PS2 18 (SUTURE) ×2 IMPLANT
SUT VIC AB 0 CT1 27 (SUTURE) ×2
SUT VIC AB 0 CT1 27XBRD ANBCTR (SUTURE) ×1 IMPLANT
SUT VIC AB 2-0 CT1 27 (SUTURE) ×2
SUT VIC AB 2-0 CT1 TAPERPNT 27 (SUTURE) ×1 IMPLANT
SUT VICRYL 0 UR6 27IN ABS (SUTURE) ×2 IMPLANT
SYR 30ML LL (SYRINGE) ×2 IMPLANT
SYR 3ML LL SCALE MARK (SYRINGE) ×3 IMPLANT
SYR 5ML LL (SYRINGE) ×1 IMPLANT
SYR BULB IRRIGATION 50ML (SYRINGE) ×2 IMPLANT
SYR TB 1ML LUER SLIP (SYRINGE) ×3 IMPLANT
SYSTEM GRAFT IMPLANT AUTOGRAFT (Anchor) ×1 IMPLANT
TOWEL OR 17X24 6PK STRL BLUE (TOWEL DISPOSABLE) ×2 IMPLANT
TOWEL OR 17X26 10 PK STRL BLUE (TOWEL DISPOSABLE) ×2 IMPLANT
UNDERPAD 30X30 (UNDERPADS AND DIAPERS) ×2 IMPLANT
WAND SERFAS ENERGY SUPER 90 (SURGICAL WAND) IMPLANT
WRAP KNEE MAXI GEL POST OP (GAUZE/BANDAGES/DRESSINGS) ×2 IMPLANT
YANKAUER SUCT BULB TIP NO VENT (SUCTIONS) ×2 IMPLANT
arthrex autograft convenience pack ×1 IMPLANT

## 2018-03-30 NOTE — Anesthesia Procedure Notes (Signed)
Procedure Name: Intubation Date/Time: 03/30/2018 8:20 AM Performed by: Scheryl Darter, CRNA Pre-anesthesia Checklist: Patient identified, Emergency Drugs available, Suction available and Patient being monitored Patient Re-evaluated:Patient Re-evaluated prior to induction Oxygen Delivery Method: Circle System Utilized Preoxygenation: Pre-oxygenation with 100% oxygen Induction Type: IV induction Ventilation: Mask ventilation without difficulty Laryngoscope Size: Mac and 4 Grade View: Grade II Tube type: Oral Tube size: 7.5 mm Number of attempts: 1 Airway Equipment and Method: Stylet and Oral airway Placement Confirmation: ETT inserted through vocal cords under direct vision,  positive ETCO2 and breath sounds checked- equal and bilateral Tube secured with: Tape Dental Injury: Teeth and Oropharynx as per pre-operative assessment

## 2018-03-30 NOTE — Progress Notes (Signed)
Orthopedic Tech Progress Note Patient Details:  Phillip [name] Feb 23, 1976 [id number]  Ortho Devices Type of Ortho Device: Crutches Ortho Device/Splint Interventions: Application   Post Interventions Patient Tolerated: Well Instructions Provided: Care of device   Hildred Priest 03/30/2018, 12:45 PM Viewed order from doctor's order list

## 2018-03-30 NOTE — Brief Op Note (Signed)
03/30/2018  10:52 AM  PATIENT:  Phillip [name]  42 y.o. male  PRE-OPERATIVE DIAGNOSIS:  Left Knee Anterior Cruciate Ligament Tear, Medial Meniscus Tear  POST-OPERATIVE DIAGNOSIS:  Left Knee Anterior Cruciate Ligament Tear, Medial Meniscus Tear  PROCEDURE:  Procedure(s): RECONSTRUCTION LEFT KNEE ANTERIOR CRUCIATE LIGAMENT (ACL) WITH HAMSTRING GRAFT AND MEDIAL MENISCAL RESECTION  SURGEON:  Surgeon(s): Meredith Pel, MD  ASSISTANT: April green rnfa  ANESTHESIA:   general  EBL: 19 ml    Total I/O In: 900 [I.V.:800; IV Piggyback:100] Out: 25 [Blood:25]  BLOOD ADMINISTERED: none  DRAINS: none   LOCAL MEDICATIONS USED:  Marcaine mso4 clonidine  SPECIMEN:  No Specimen  COUNTS:  YES  TOURNIQUET:  * Missing tourniquet times found for documented tourniquets in log: [id number] *  DICTATION: .Other Dictation: Dictation Number [id number]  PLAN OF CARE: Discharge to home after PACU  PATIENT DISPOSITION:  PACU - hemodynamically stable

## 2018-03-30 NOTE — Telephone Encounter (Signed)
Ok thanks - can you call him and have him use it 1 hour 3 - 4 times a day

## 2018-03-30 NOTE — Anesthesia Postprocedure Evaluation (Signed)
Anesthesia Post Note  Patient: Phillip [name]  Procedure(s) Performed: RECONSTRUCTION LEFT KNEE ANTERIOR CRUCIATE LIGAMENT (ACL) WITH HAMSTRING GRAFT AND MEDIAL MENISCAL RESECTION AND/OR REPAIR (Left Knee)     Patient location during evaluation: PACU Anesthesia Type: General Level of consciousness: awake Pain management: pain level controlled Vital Signs Assessment: post-procedure vital signs reviewed and stable Respiratory status: spontaneous breathing Cardiovascular status: stable Postop Assessment: no apparent nausea or vomiting Anesthetic complications: no    Last Vitals:  Vitals:   03/30/18 1103 03/30/18 1115  BP: 127/81 124/80  Pulse:  92  Resp:  (!) 25  Temp:    SpO2:  99%    Last Pain:  Vitals:   03/30/18 1130  TempSrc:   PainSc: Asleep   Pain Goal:                 Phillip [name],Phillip [name]

## 2018-03-30 NOTE — Op Note (Signed)
NAME:  Phillip [name], Phillip [name]                     ACCOUNT NO.:  MEDICAL RECORD NO.:  [id number]  LOCATION:                                 FACILITY:  PHYSICIAN:  [name], M.D.         DATE OF BIRTH:  DATE OF PROCEDURE: DATE OF DISCHARGE:                              OPERATIVE REPORT   PREOPERATIVE DIAGNOSES:  Left knee anterior cruciate ligament tear, medial meniscal tear.  POSTOPERATIVE DIAGNOSES:  Left knee anterior cruciate ligament tear, medial meniscal tear.  PROCEDURES:  Left knee anterior cruciate ligament reconstruction using 8- mm hamstring autograft, Arthrex Endobutton x2; partial medial meniscectomy.  SURGEON:  [name], M.D.  ASSISTANT:  April Green, RNFA.  INDICATIONS:  Phillip [name] is a 42 year old patient with left knee ACL tear and medial meniscal tear.  Presents for operative management after explanation of risks and benefits.  OPERATIVE FINDINGS: 1. Examination under anesthesia, range of motion.  The patient lacked     about 5 degrees of full extension, but had full flexion. 2. The patient had stable collateral ligaments at 0 and 30 degrees to     varus and valgus stress, ACL out, PCL intact.  No posterolateral     rotatory instability is noted. 3. Diagnostic and operative arthroscopy:     a.     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     b.     No loose bodies in medial and lateral gutter.     c.     Intact lateral compartment articular cartilage and meniscus.     d.     Torn ACL, intact PCL.     e.     Tear of the posterior horn medial meniscus in the red-white      zone with unstable flaps that did not appear to be repairable.      This involved at most 50% anterior-posterior width of the meniscus      over the posterior 2 cm of the knee, posterior aspect of the      medial compartment.  PROCEDURE IN DETAIL:  The patient was brought to the operating room where general  anesthesia was induced.  Preoperative antibiotics were administered.  Time-out was called.  Left leg was prescrubbed with alcohol and Betadine and allowed to air dry, prepped with DuraPrep solution and draped in a sterile manner.  [name] was used to cover the operative field.  Time-out was called.  Initially, the portals were anesthetized with 5 mL each of 0.25% Marcaine with epinephrine. Incision was then made over the pes bursa tendons.  The semitendinosus was then dissected and harvested and then prepared on the back table using Arthrex Endobutton technique to a size of 8 mm.  This was a very solid style graft.  Concurrently, this incision was irrigated.  The anterior-inferior lateral, anterior-inferior medial portals were established.  Diagnostic arthroscopy demonstrated grade 1 chondromalacia on the undersurface of the patella.  No loose bodies in medial and lateral gutter.  ACL was torn.  PCL intact.  Lateral compartment  intact, meniscus and articular cartilage.  Medial compartment had unstable meniscal tear, which did not look repairable due to the both degenerative and traumatic nature.  That meniscal tear was resected back to a stable rim using a combination of basket punch and shaver.  About 50% of the rim remained over the posterior 2 cm circumference of that medial compartment meniscal attachment site.  The notchplasty was performed.  Using outside-in technique with an 8-mm FlipCutter, the tunnel was drilled in the 3 o'clock position.  Concurrently, the FlipCutter was also used to drill the tunnel on the tibia.  That was done in the posterior aspect of the native ACL footprint.  Graft was then passed with bone graft placed into the tunnels.  The graft was secured and under fluoroscopic guidance, the Endobutton was noted to be well fixed on the lateral aspect of the distal femur.  The graft was then tensioned with the knee in extension.  Excellent tension was obtained.   Following this, the knee was taken through a range of motion and found to have very good stability.  Thorough irrigation was then performed.  Portals were irrigated and closed using 2-0 Vicryl and 3-0 nylon.  Harvest site was closed using 0 Vicryl, 2-0 Vicryl, and 3-0 Monocryl.  Impervious dressings followed by Ace wrap, ice pack and knee brace applied.  The patient tolerated the procedure well without immediate complication, transferred to the recovery room in stable condition.  It should be noted that a solution of Marcaine, morphine, and clonidine used to inject both the knee as well as the portal sites.     [name], M.D.     GSD/MEDQ  D:  03/30/2018  T:  03/30/2018  Job:  [id number]

## 2018-03-30 NOTE — Interval H&P Note (Signed)
History and Physical Interval Note:  03/30/2018 7:29 AM  Phillip [name]  has presented today for surgery, with the diagnosis of Left Knee Anterior Cruciate Ligament Tear, Medial Meniscus Tear  The various methods of treatment have been discussed with the patient and family. After consideration of risks, benefits and other options for treatment, the patient has consented to  Procedure(s): RECONSTRUCTION LEFT KNEE ANTERIOR CRUCIATE LIGAMENT (ACL) WITH HAMSTRING GRAFT AND MEDIAL MENISCAL RESECTION AND/OR REPAIR (Left) as a surgical intervention .  The patient's history has been reviewed, patient examined, no change in status, stable for surgery.  I have reviewed the patient's chart and labs.  Questions were answered to the patient's satisfaction.     [name]

## 2018-03-30 NOTE — Discharge Instructions (Signed)
Begin knee range of motion exercises today.  This includes Bending as well as keeping the leg straight.  30-40 reps per hour starting tonight at 9:00 Remove Ace wrap on left knee tomorrow.  Dressings underneath or waterproof. Okay to shower.  Dressings are waterproof Okay to weight-bear 50% of weight with crutches on left leg Always work on keeping the leg straight with pillows under the heel   Post Anesthesia Home Care Instructions  Activity: Get plenty of rest for the remainder of the day. A responsible individual must stay with you for 24 hours following the procedure.  For the next 24 hours, DO NOT: -Drive a car -Paediatric nurse -Drink alcoholic beverages -Take any medication unless instructed by your physician -Make any legal decisions or sign important papers.  Meals: Start with liquid foods such as gelatin or soup. Progress to regular foods as tolerated. Avoid greasy, spicy, heavy foods. If nausea and/or vomiting occur, drink only clear liquids until the nausea and/or vomiting subsides. Call your physician if vomiting continues.  Special Instructions/Symptoms: Your throat may feel dry or sore from the anesthesia or the breathing tube placed in your throat during surgery. If this causes discomfort, gargle with warm salt water. The discomfort should disappear within 24 hours.  If you had a scopolamine patch placed behind your ear for the management of post- operative nausea and/or vomiting:  1. The medication in the patch is effective for 72 hours, after which it should be removed.  Wrap patch in a tissue and discard in the trash. Wash hands thoroughly with soap and water. 2. You may remove the patch earlier than 72 hours if you experience unpleasant side effects which may include dry mouth, dizziness or visual disturbances. 3. Avoid touching the patch. Wash your hands with soap and water after contact with the patch.   Regional Anesthesia Blocks  1. Numbness or the inability  to move the "blocked" extremity may last from 3-48 hours after placement. The length of time depends on the medication injected and your individual response to the medication. If the numbness is not going away after 48 hours, call your surgeon.  2. The extremity that is blocked will need to be protected until the numbness is gone and the  Strength has returned. Because you cannot feel it, you will need to take extra care to avoid injury. Because it may be weak, you may have difficulty moving it or using it. You may not know what position it is in without looking at it while the block is in effect.  3. For blocks in the legs and feet, returning to weight bearing and walking needs to be done carefully. You will need to wait until the numbness is entirely gone and the strength has returned. You should be able to move your leg and foot normally before you try and bear weight or walk. You will need someone to be with you when you first try to ensure you do not fall and possibly risk injury.  4. Bruising and tenderness at the needle site are common side effects and will resolve in a few days.  5. Persistent numbness or new problems with movement should be communicated to the surgeon or the [facility] [phone] [place] [phone]).

## 2018-03-30 NOTE — Transfer of Care (Signed)
Immediate Anesthesia Transfer of Care Note  Patient: Phillip [name]  Procedure(s) Performed: RECONSTRUCTION LEFT KNEE ANTERIOR CRUCIATE LIGAMENT (ACL) WITH HAMSTRING GRAFT AND MEDIAL MENISCAL RESECTION AND/OR REPAIR (Left Knee)  Patient Location: PACU  Anesthesia Type:General and Regional  Level of Consciousness: awake, alert , oriented and sedated  Airway & Oxygen Therapy: Patient Spontanous Breathing and Patient connected to nasal cannula oxygen  Post-op Assessment: Report given to RN, Post -op Vital signs reviewed and stable and Patient moving all extremities  Post vital signs: Reviewed and stable  Last Vitals:  Vitals Value Taken Time  BP 125/69 03/30/2018 10:45 AM  Temp    Pulse 85 03/30/2018 10:47 AM  Resp 28 03/30/2018 10:47 AM  SpO2 99 % 03/30/2018 10:47 AM  Vitals shown include unvalidated device data.  Last Pain:  Vitals:   03/30/18 0624  TempSrc:   PainSc: 0-No pain         Complications: No apparent anesthesia complications

## 2018-03-30 NOTE — Telephone Encounter (Signed)
fyi

## 2018-03-30 NOTE — Anesthesia Procedure Notes (Signed)
Anesthesia Regional Block: Adductor canal block   Pre-Anesthetic Checklist: ,, timeout performed, Correct Patient, Correct Site, Correct Laterality, Correct Procedure, Correct Position, site marked, Risks and benefits discussed,  Surgical consent,  Pre-op evaluation,  At surgeon's request and post-op pain management  Laterality: Left and Lower  Prep: chloraprep       Needles:  Injection technique: Single-shot  Needle Type: Echogenic Stimulator Needle     Needle Length: 10cm  Needle Gauge: 21   Needle insertion depth: 3 cm   Additional Needles:   Procedures:,,,, ultrasound used (permanent image in chart),,,,  Narrative:  Start time: 03/30/2018 7:50 AM End time: 03/30/2018 7:59 AM Injection made incrementally with aspirations every 5 mL.  Performed by: Personally  Anesthesiologist: Lyn Hollingshead, MD

## 2018-03-30 NOTE — Telephone Encounter (Signed)
Ruby Cola from [employer] calling to let you know that patient declined the CPM machine because [employer] doesn't cover it, however Ruby Cola offered the CPM machine to the the patient at NO CHARGE for 2 WEEKS. The patient accepted. Patient also received the brace.The machine will be at the patient's home tomorrow.    If you have any questions, Ruby Cola can be reached at [phone]

## 2018-03-31 ENCOUNTER — Encounter (HOSPITAL_COMMUNITY): Payer: Self-pay | Admitting: Orthopedic Surgery

## 2018-03-31 NOTE — Telephone Encounter (Signed)
IC LM advising for pt.

## 2018-04-01 NOTE — Telephone Encounter (Signed)
See note from patient, please advise how far out he Marilla Boddy start PT?  I can discuss CPM with him.

## 2018-04-01 NOTE — Telephone Encounter (Signed)
Patient had LEFT KNEE ACL RECONSTRUCTION on 03/30/18 and is calling with questions pertaining to physical therapy, states he recalls Dr. Marlou Sa discussing PT options before the surgery was scheduled. He would like to know when therapy will take place and where.  The CPM machine was delivered and patient was told by the [employer] Rep to use 3 times per day for 90 minutes. Please advise. Patient's post op visit is this Wednesday, May 1st.

## 2018-04-02 NOTE — Telephone Encounter (Signed)
I tried to call patient, NA, cannot leave message, mailbox full.

## 2018-04-02 NOTE — Telephone Encounter (Signed)
Hold on pt if using cpm - work on full extension - pt to be scheduled after first po visit

## 2018-04-02 NOTE — Telephone Encounter (Signed)
Patient called me back, I advised.

## 2018-04-07 ENCOUNTER — Inpatient Hospital Stay (INDEPENDENT_AMBULATORY_CARE_PROVIDER_SITE_OTHER): Payer: BC Managed Care – PPO | Admitting: Orthopedic Surgery

## 2018-04-14 ENCOUNTER — Inpatient Hospital Stay (INDEPENDENT_AMBULATORY_CARE_PROVIDER_SITE_OTHER): Payer: BC Managed Care – PPO | Admitting: Orthopedic Surgery

## 2018-04-15 ENCOUNTER — Encounter (INDEPENDENT_AMBULATORY_CARE_PROVIDER_SITE_OTHER): Payer: Self-pay | Admitting: Orthopedic Surgery

## 2018-04-15 ENCOUNTER — Ambulatory Visit (INDEPENDENT_AMBULATORY_CARE_PROVIDER_SITE_OTHER): Payer: BC Managed Care – PPO | Admitting: Orthopedic Surgery

## 2018-04-15 DIAGNOSIS — Z9889 Other specified postprocedural states: Secondary | ICD-10-CM

## 2018-04-15 MED ORDER — METHOCARBAMOL 500 MG PO TABS: 500.0000 mg | ORAL_TABLET | Freq: Three times a day (TID) | ORAL | 0 refills | Status: DC | PRN

## 2018-04-18 ENCOUNTER — Encounter (INDEPENDENT_AMBULATORY_CARE_PROVIDER_SITE_OTHER): Payer: Self-pay | Admitting: Orthopedic Surgery

## 2018-04-18 NOTE — Progress Notes (Signed)
   Post-Op Visit Note   Patient: Phillip [name]           Date of Birth: 1976/05/16           MRN: [id number] Visit Date: 04/15/2018 PCP: Patient, No Pcp Per   Assessment & Plan:  Chief Complaint:  Chief Complaint  Patient presents with  . Left Knee - Routine Post Op   Visit Diagnoses:  1. S/P ACL reconstruction     Plan: Mayco is a patient who is now 2 weeks out left knee ACL reconstruction and medial meniscal partial resection.  He is been doing well.  CPM is up to 90 degrees.  It is hard for him to sleep.  On examination he is lacking about 10 degrees of full extension but has about 85 of cold flexion.  Graft is stable.  Mild knee effusion is present.  Plan is to refill Robaxin.  No tenderness in the calf.  Homans negative.  Start physical therapy.  2-week return.  I do want him to focus on achieving full extension.  Follow-Up Instructions: Return in about 2 weeks (around 04/29/2018).   Orders:  Orders Placed This Encounter  Procedures  . Ambulatory referral to Physical Therapy   Meds ordered this encounter  Medications  . methocarbamol (ROBAXIN) 500 MG tablet    Sig: Take 1 tablet (500 mg total) by mouth every 8 (eight) hours as needed for muscle spasms.    Dispense:  30 tablet    Refill:  0    Imaging: No results found.  PMFS History: Patient Active Problem List   Diagnosis Date Noted  . Left anterior cruciate ligament tear 01/26/2018  . Tear of medial meniscus of left knee, current 01/26/2018  . Closed fracture of left tibial plateau 01/11/2018   Past Medical History:  Diagnosis Date  . Medical history non-contributory     History reviewed. No pertinent family history.  Past Surgical History:  Procedure Laterality Date  . ANTERIOR CRUCIATE LIGAMENT REPAIR Left 03/30/2018   Procedure: RECONSTRUCTION LEFT KNEE ANTERIOR CRUCIATE LIGAMENT (ACL) WITH HAMSTRING GRAFT AND MEDIAL MENISCAL RESECTION AND/OR REPAIR;  Surgeon: Meredith Pel, MD;  Location: [facility];  Service: Orthopedics;  Laterality: Left;  . NO PAST SURGERIES     Social History   Occupational History  . Not on file  Tobacco Use  . Smoking status: Never Smoker  . Smokeless tobacco: Never Used  Substance and Sexual Activity  . Alcohol use: Yes    Comment: occ  . Drug use: Not on file  . Sexual activity: Not on file

## 2018-04-26 ENCOUNTER — Other Ambulatory Visit (INDEPENDENT_AMBULATORY_CARE_PROVIDER_SITE_OTHER): Payer: Self-pay | Admitting: Orthopedic Surgery

## 2018-04-26 NOTE — Telephone Encounter (Signed)
Rx request 

## 2018-04-26 NOTE — Telephone Encounter (Signed)
Patient had ACL recon on 03/30/18, please advise on this refill if needed?

## 2018-04-27 NOTE — Telephone Encounter (Signed)
Not needed

## 2018-04-28 ENCOUNTER — Ambulatory Visit (INDEPENDENT_AMBULATORY_CARE_PROVIDER_SITE_OTHER): Payer: BC Managed Care – PPO | Admitting: Orthopedic Surgery

## 2018-04-28 ENCOUNTER — Encounter (INDEPENDENT_AMBULATORY_CARE_PROVIDER_SITE_OTHER): Payer: Self-pay | Admitting: Orthopedic Surgery

## 2018-04-28 DIAGNOSIS — Z9889 Other specified postprocedural states: Secondary | ICD-10-CM

## 2018-04-28 NOTE — Progress Notes (Signed)
   Post-Op Visit Note   Patient: Phillip [name]           Date of Birth: 1976/06/24           MRN: [id number] Visit Date: 04/28/2018 PCP: Patient, No Pcp Per   Assessment & Plan:  Chief Complaint:  Chief Complaint  Patient presents with  . Left Knee - Pain   Visit Diagnoses:  1. S/P ACL reconstruction     Plan: Phillip [name] is a patient is a month out left knee ACL reconstruction.  Been doing well.  On exam he is got about 10 degree flexion contracture but flexion easily past 90.  Graft is stable.  No calf tenderness.  He is actually walking well.  Plan is to give him a heel lift for his right foot so that he will straighten out that left and a little bit more.  I did tell him I needed him working an hour a day on full extension.  3-week recheck to check on extension.  He is not taking any oxycodone only Robaxin and Tylenol.  Follow-Up Instructions: Return in about 3 weeks (around 05/19/2018).   Orders:  No orders of the defined types were placed in this encounter.  No orders of the defined types were placed in this encounter.   Imaging: No results found.  PMFS History: Patient Active Problem List   Diagnosis Date Noted  . Left anterior cruciate ligament tear 01/26/2018  . Tear of medial meniscus of left knee, current 01/26/2018  . Closed fracture of left tibial plateau 01/11/2018   Past Medical History:  Diagnosis Date  . Medical history non-contributory     History reviewed. No pertinent family history.  Past Surgical History:  Procedure Laterality Date  . ANTERIOR CRUCIATE LIGAMENT REPAIR Left 03/30/2018   Procedure: RECONSTRUCTION LEFT KNEE ANTERIOR CRUCIATE LIGAMENT (ACL) WITH HAMSTRING GRAFT AND MEDIAL MENISCAL RESECTION AND/OR REPAIR;  Surgeon: Meredith Pel, MD;  Location: [facility];  Service: Orthopedics;  Laterality: Left;  . NO PAST SURGERIES     Social History   Occupational History  . Not on file  Tobacco Use  . Smoking status: Never Smoker  .  Smokeless tobacco: Never Used  Substance and Sexual Activity  . Alcohol use: Yes    Comment: occ  . Drug use: Not on file  . Sexual activity: Not on file

## 2018-05-19 ENCOUNTER — Encounter (INDEPENDENT_AMBULATORY_CARE_PROVIDER_SITE_OTHER): Payer: Self-pay | Admitting: Orthopedic Surgery

## 2018-05-19 ENCOUNTER — Ambulatory Visit (INDEPENDENT_AMBULATORY_CARE_PROVIDER_SITE_OTHER): Payer: BC Managed Care – PPO | Admitting: Orthopedic Surgery

## 2018-05-19 DIAGNOSIS — Z9889 Other specified postprocedural states: Secondary | ICD-10-CM

## 2018-05-19 NOTE — Progress Notes (Signed)
   Post-Op Visit Note   Patient: Phillip [name]           Date of Birth: [birth year]           MRN: [id number] Visit Date: 05/19/2018 PCP: Patient, No Pcp Per   Assessment & Plan:  Chief Complaint:  Chief Complaint  Patient presents with  . Left Knee - Follow-up   Visit Diagnoses:  1. S/P ACL reconstruction     Plan: Jearl is now about 7 weeks out left knee ACL reconstruction.  He is been doing well.  Start physical therapy due to financial reasons which were is $75 a visit for co-pay.  Is having visit minimal pain.  On exam he is lacking less than 5 degrees from full extension which is excellent.  Right knee hyperextends about 10 degrees.  He has flexion to about 110 and very good graft stability and only trace effusion.  Plan at this time is to continue with his closed chain quad strengthening exercises and range of motion.  Come back in 2-1/2 months for clinical recheck and possible straightahead running at that time.  Follow-Up Instructions: Return in about 2 months (around 07/28/2018).   Orders:  No orders of the defined types were placed in this encounter.  No orders of the defined types were placed in this encounter.   Imaging: No results found.  PMFS History: Patient Active Problem List   Diagnosis Date Noted  . Left anterior cruciate ligament tear 01/26/2018  . Tear of medial meniscus of left knee, current 01/26/2018  . Closed fracture of left tibial plateau 01/11/2018   Past Medical History:  Diagnosis Date  . Medical history non-contributory     History reviewed. No pertinent family history.  Past Surgical History:  Procedure Laterality Date  . ANTERIOR CRUCIATE LIGAMENT REPAIR Left 03/30/2018   Procedure: RECONSTRUCTION LEFT KNEE ANTERIOR CRUCIATE LIGAMENT (ACL) WITH HAMSTRING GRAFT AND MEDIAL MENISCAL RESECTION AND/OR REPAIR;  Surgeon: Meredith Pel, MD;  Location: [facility];  Service: Orthopedics;  Laterality: Left;  . NO PAST SURGERIES     Social  History   Occupational History  . Not on file  Tobacco Use  . Smoking status: Never Smoker  . Smokeless tobacco: Never Used  Substance and Sexual Activity  . Alcohol use: Yes    Comment: occ  . Drug use: Not on file  . Sexual activity: Not on file

## 2018-06-17 ENCOUNTER — Encounter (INDEPENDENT_AMBULATORY_CARE_PROVIDER_SITE_OTHER): Payer: Self-pay | Admitting: Orthopedic Surgery

## 2018-08-11 ENCOUNTER — Ambulatory Visit (INDEPENDENT_AMBULATORY_CARE_PROVIDER_SITE_OTHER): Payer: BC Managed Care – PPO | Admitting: Orthopedic Surgery

## 2018-08-25 ENCOUNTER — Ambulatory Visit (INDEPENDENT_AMBULATORY_CARE_PROVIDER_SITE_OTHER): Payer: BC Managed Care – PPO | Admitting: Orthopedic Surgery

## 2019-11-01 ENCOUNTER — Other Ambulatory Visit: Payer: Self-pay

## 2019-11-01 DIAGNOSIS — Z20822 Contact with and (suspected) exposure to covid-19: Secondary | ICD-10-CM

## 2019-11-03 LAB — NOVEL CORONAVIRUS, NAA: SARS-CoV-2, NAA: NOT DETECTED

## 2020-01-04 ENCOUNTER — Encounter: Payer: Self-pay | Admitting: Family Medicine

## 2020-01-04 ENCOUNTER — Ambulatory Visit: Payer: BC Managed Care – PPO | Admitting: Family Medicine

## 2020-01-04 ENCOUNTER — Other Ambulatory Visit: Payer: Self-pay

## 2020-01-04 VITALS — BP 120/80 | HR 69 | Temp 97.1°F | Ht 68.5 in | Wt 181.2 lb

## 2020-01-04 DIAGNOSIS — Z23 Encounter for immunization: Secondary | ICD-10-CM

## 2020-01-04 DIAGNOSIS — Z1329 Encounter for screening for other suspected endocrine disorder: Secondary | ICD-10-CM

## 2020-01-04 DIAGNOSIS — K641 Second degree hemorrhoids: Secondary | ICD-10-CM | POA: Diagnosis not present

## 2020-01-04 DIAGNOSIS — Z1211 Encounter for screening for malignant neoplasm of colon: Secondary | ICD-10-CM | POA: Diagnosis not present

## 2020-01-04 DIAGNOSIS — Z13220 Encounter for screening for lipoid disorders: Secondary | ICD-10-CM

## 2020-01-04 DIAGNOSIS — Z125 Encounter for screening for malignant neoplasm of prostate: Secondary | ICD-10-CM

## 2020-01-04 DIAGNOSIS — Z0000 Encounter for general adult medical examination without abnormal findings: Secondary | ICD-10-CM

## 2020-01-04 MED ORDER — HYDROCORTISONE ACE-PRAMOXINE 1-1 % EX CREA
1.0000 | TOPICAL_CREAM | Freq: Two times a day (BID) | CUTANEOUS | 0 refills | Status: DC
Start: 2020-01-04 — End: 2020-04-02

## 2020-01-04 NOTE — Patient Instructions (Addendum)
I recommend taking pressure off your back side to help with hemorrhoids.   You will receive a call from [facility] regarding your colonoscopy. It is unclear as to whether your insurance will cover it at this point but they can advise you.      Preventive Care 40-46 Years Old, Male Preventive care refers to lifestyle choices and visits with your health care provider that can promote health and wellness. This includes:  A yearly physical exam. This is also called an annual well check.  Regular dental and eye exams.  Immunizations.  Screening for certain conditions.  Healthy lifestyle choices, such as eating a healthy diet, getting regular exercise, not using drugs or products that contain nicotine and tobacco, and limiting alcohol use. What can I expect for my preventive care visit? Physical exam Your health care provider will check:  Height and weight. These may be used to calculate body mass index (BMI), which is a measurement that tells if you are at a healthy weight.  Heart rate and blood pressure.  Your skin for abnormal spots. Counseling Your health care provider may ask you questions about:  Alcohol, tobacco, and drug use.  Emotional well-being.  Home and relationship well-being.  Sexual activity.  Eating habits.  Work and work environment. What immunizations do I need?  Influenza (flu) vaccine  This is recommended every year. Tetanus, diphtheria, and pertussis (Tdap) vaccine  You may need a Td booster every 10 years. Varicella (chickenpox) vaccine  You may need this vaccine if you have not already been vaccinated. Zoster (shingles) vaccine  You may need this after age 60. Measles, mumps, and rubella (MMR) vaccine  You may need at least one dose of MMR if you were born in 1957 or later. You may also need a second dose. Pneumococcal conjugate (PCV13) vaccine  You may need this if you have certain conditions and were not previously  vaccinated. Pneumococcal polysaccharide (PPSV23) vaccine  You may need one or two doses if you smoke cigarettes or if you have certain conditions. Meningococcal conjugate (MenACWY) vaccine  You may need this if you have certain conditions. Hepatitis A vaccine  You may need this if you have certain conditions or if you travel or work in places where you may be exposed to hepatitis A. Hepatitis B vaccine  You may need this if you have certain conditions or if you travel or work in places where you may be exposed to hepatitis B. Haemophilus influenzae type b (Hib) vaccine  You may need this if you have certain risk factors. Human papillomavirus (HPV) vaccine  If recommended by your health care provider, you may need three doses over 6 months. You may receive vaccines as individual doses or as more than one vaccine together in one shot (combination vaccines). Talk with your health care provider about the risks and benefits of combination vaccines. What tests do I need? Blood tests  Lipid and cholesterol levels. These may be checked every 5 years, or more frequently if you are over 50 years old.  Hepatitis C test.  Hepatitis B test. Screening  Lung cancer screening. You may have this screening every year starting at age 55 if you have a 30-pack-year history of smoking and currently smoke or have quit within the past 15 years.  Prostate cancer screening. Recommendations will vary depending on your family history and other risks.  Colorectal cancer screening. All adults should have this screening starting at age 50 and continuing until age 75. Your   health care provider may recommend screening at age 45 if you are at increased risk. You will have tests every 1-10 years, depending on your results and the type of screening test.  Diabetes screening. This is done by checking your blood sugar (glucose) after you have not eaten for a while (fasting). You may have this done every 1-3  years.  Sexually transmitted disease (STD) testing. Follow these instructions at home: Eating and drinking  Eat a diet that includes fresh fruits and vegetables, whole grains, lean protein, and low-fat dairy products.  Take vitamin and mineral supplements as recommended by your health care provider.  Do not drink alcohol if your health care provider tells you not to drink.  If you drink alcohol: ? Limit how much you have to 0-2 drinks a day. ? Be aware of how much alcohol is in your drink. In the [place] one drink equals one 12 oz bottle of beer (355 mL), one 5 oz glass of wine (148 mL), or one 1 oz glass of hard liquor (44 mL). Lifestyle  Take daily care of your teeth and gums.  Stay active. Exercise for at least 30 minutes on 5 or more days each week.  Do not use any products that contain nicotine or tobacco, such as cigarettes, e-cigarettes, and chewing tobacco. If you need help quitting, ask your health care provider.  If you are sexually active, practice safe sex. Use a condom or other form of protection to prevent STIs (sexually transmitted infections).  Talk with your health care provider about taking a low-dose aspirin every day starting at age 50. What's next?  Go to your health care provider once a year for a well check visit.  Ask your health care provider how often you should have your eyes and teeth checked.  Stay up to date on all vaccines. This information is not intended to replace advice given to you by your health care provider. Make sure you discuss any questions you have with your health care provider. Document Revised: 11/18/2018 Document Reviewed: 11/18/2018 Elsevier Patient Education  2020 Elsevier Inc.  

## 2020-01-04 NOTE — Progress Notes (Signed)
Subjective:    Patient ID: Phillip [name], male    DOB: [birth year], 44 y.o.   MRN: [id number]  HPI Chief Complaint  Patient presents with  . new pt    new pt, get established. hemorrhoids discomfort.    He is new to the practice and here for a complete physical exam. Previous medical care: ?[facility] but no regular medical care in years  Last CPE: years ago   Other providers: None   No significant medical or surgical history.   Complains of a 6 month history of hemorrhoids. No bleeding. Has been using topical therapies.  Denies constipation or hard stools.  States he has been sitting more since working from home over the past year.   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  Denies smoking, drug use. Alcohol use socially  Diet: fairly healthy  Exercise: 5 times per week   Immunizations: flu shot would like this today   Health maintenance:  Colonoscopy: never  Last PSA: never  Last Dental Exam: October 2020 Last Eye Exam: last year   Wears seatbelt always, smoke detectors in home and functioning, does not text while driving, feels safe in home environment.  Reviewed allergies, medications, past medical, surgical, family, and social history.    Review of Systems  Review of Systems Constitutional: -fever, -chills, -sweats, -unexpected weight change,-fatigue ENT: -runny nose, -ear pain, -sore throat Cardiology:  -chest pain, -palpitations, -edema Respiratory: -cough, -shortness of breath, -wheezing Gastroenterology: -abdominal pain, -nausea, -vomiting, -diarrhea, -constipation  Hematology: -bleeding or bruising problems Musculoskeletal: -arthralgias, -myalgias, -joint swelling, -back pain Ophthalmology: -vision changes Urology: -dysuria, -difficulty urinating, -hematuria, -urinary frequency, -urgency Neurology: -headache, -weakness, -tingling, -numbness       Objective:   Physical Exam BP  120/80   Pulse 69   Temp (!) 97.1 F (36.2 C)   Ht 5' 8.5" (1.74 m)   Wt 181 lb 3.2 oz (82.2 kg)   BMI 27.15 kg/m   General Appearance:    Alert, cooperative, no distress, appears stated age  Head:    Normocephalic, without obvious abnormality, atraumatic  Eyes:    PERRL, conjunctiva/corneas clear, EOM's intact  Ears:    Normal TM's and external ear canals  Nose:   Mask in place   Throat:   Mask in place   Neck:   Supple, no lymphadenopathy;  thyroid:  no   enlargement/tenderness/nodules; no JVD  Back:    Spine nontender, no curvature, ROM normal, no CVA     tenderness  Lungs:     Clear to auscultation bilaterally without wheezes, rales or     ronchi; respirations unlabored  Chest Wall:    No tenderness or deformity   Heart:    Regular rate and rhythm, S1 and S2 normal, no murmur, rub   or gallop  Breast Exam:    No chest wall tenderness, masses or gynecomastia  Abdomen:     Soft, non-tender, nondistended, normoactive bowel sounds,    no masses, no hepatosplenomegaly  Genitalia:    Normal male external genitalia without lesions.  Testicles without masses.  No inguinal hernias.  Rectal:    Normal sphincter tone, no masses or tenderness; guaiac negative stool.  Prostate smooth, no nodules, not enlarged. External hemorrhoids, easily compressible. No blood.   Extremities:   No clubbing, cyanosis or edema  Pulses:   2+ and symmetric all extremities  Skin:   Skin color, texture,  turgor normal, no rashes or lesions  Lymph nodes:   Cervical, supraclavicular, and axillary nodes normal  Neurologic:   CNII-XII intact, normal strength, sensation and gait; reflexes 2+ and symmetric throughout          Psych:   Normal mood, affect, hygiene and grooming.         Assessment & Plan:  Routine general medical examination at a health care facility - Plan: CBC with Differential/Platelet, Comprehensive metabolic panel, TSH, T4, free, Lipid panel -Here today for a new patient fasting CPE.  He has not  had any regular medical care in several years.  Is a pleasant healthy-appearing male who is in good spirits.  Counseling on preventive healthcare.  Immunizations reviewed and updated.  Discussed healthy lifestyle including diet and exercise.  Safety also discussed  Needs flu shot - Plan: Flu Vaccine QUAD 36+ mos IM -Counseling on potential side effects  Need for diphtheria-tetanus-pertussis (Tdap) vaccine - Plan: Tdap vaccine greater than or equal to 7yo IM -Counseling on all components of the vaccine including side effects  Screen for colon cancer - Plan: Ambulatory referral to Gastroenterology Discussed per new guidelines I would refer for screening colonoscopy but that he will need to check if insurance will cover  Grade II hemorrhoids - Plan: Ambulatory referral to Gastroenterology, pramoxine-hydrocortisone (PROCTOCREAM-HC) 1-1 % rectal cream -suspect hemorrhoids related to sitting while working from home and recommend he try to avoid sitting for long periods. May try sitz baths and Proctocream. Keep stools soft.   Screening for prostate cancer - Plan: PSA Counseling on controversial nature of PSA and he would like to have this checked.   Screening for lipid disorders - Plan: Lipid panel  Screening for thyroid disorder - Plan: TSH, T4, free

## 2020-01-05 LAB — CBC WITH DIFFERENTIAL/PLATELET
Basophils Absolute: 0 10*3/uL (ref 0.0–0.2)
Basos: 1 %
EOS (ABSOLUTE): 0 10*3/uL (ref 0.0–0.4)
Eos: 0 %
Hematocrit: 43.7 % (ref 37.5–51.0)
Hemoglobin: 14.9 g/dL (ref 13.0–17.7)
Immature Grans (Abs): 0 10*3/uL (ref 0.0–0.1)
Immature Granulocytes: 0 %
Lymphocytes Absolute: 1.3 10*3/uL (ref 0.7–3.1)
Lymphs: 25 %
MCH: 29.9 pg (ref 26.6–33.0)
MCHC: 34.1 g/dL (ref 31.5–35.7)
MCV: 88 fL (ref 79–97)
Monocytes Absolute: 0.9 10*3/uL (ref 0.1–0.9)
Monocytes: 17 %
Neutrophils Absolute: 2.9 10*3/uL (ref 1.4–7.0)
Neutrophils: 57 %
Platelets: 305 10*3/uL (ref 150–450)
RBC: 4.98 x10E6/uL (ref 4.14–5.80)
RDW: 12.4 % (ref 11.6–15.4)
WBC: 5.2 10*3/uL (ref 3.4–10.8)

## 2020-01-05 LAB — COMPREHENSIVE METABOLIC PANEL
ALT: 13 IU/L (ref 0–44)
AST: 15 IU/L (ref 0–40)
Albumin/Globulin Ratio: 1.5 (ref 1.2–2.2)
Albumin: 4.6 g/dL (ref 4.0–5.0)
Alkaline Phosphatase: 76 IU/L (ref 39–117)
BUN/Creatinine Ratio: 8 — ABNORMAL LOW (ref 9–20)
BUN: 11 mg/dL (ref 6–24)
Bilirubin Total: 0.3 mg/dL (ref 0.0–1.2)
CO2: 23 mmol/L (ref 20–29)
Calcium: 9.6 mg/dL (ref 8.7–10.2)
Chloride: 99 mmol/L (ref 96–106)
Creatinine, Ser: 1.43 mg/dL — ABNORMAL HIGH (ref 0.76–1.27)
GFR calc Af Amer: 68 mL/min/{1.73_m2} (ref >59)
GFR calc non Af Amer: 59 mL/min/{1.73_m2} — ABNORMAL LOW (ref >59)
Globulin, Total: 3.1 g/dL (ref 1.5–4.5)
Glucose: 92 mg/dL (ref 65–99)
Potassium: 4.4 mmol/L (ref 3.5–5.2)
Sodium: 137 mmol/L (ref 134–144)
Total Protein: 7.7 g/dL (ref 6.0–8.5)

## 2020-01-05 LAB — LIPID PANEL
Chol/HDL Ratio: 3.7 ratio (ref 0.0–5.0)
Cholesterol, Total: 146 mg/dL (ref 100–199)
HDL: 40 mg/dL (ref >39)
LDL Chol Calc (NIH): 95 mg/dL (ref 0–99)
Triglycerides: 50 mg/dL (ref 0–149)
VLDL Cholesterol Cal: 11 mg/dL (ref 5–40)

## 2020-01-05 LAB — T4, FREE: Free T4: 1.14 ng/dL (ref 0.82–1.77)

## 2020-01-05 LAB — TSH: TSH: 1.49 u[IU]/mL (ref 0.450–4.500)

## 2020-01-05 LAB — PSA: Prostate Specific Ag, Serum: 0.6 ng/mL (ref 0.0–4.0)

## 2020-01-06 ENCOUNTER — Encounter: Payer: Self-pay | Admitting: Gastroenterology

## 2020-01-06 ENCOUNTER — Ambulatory Visit: Payer: BC Managed Care – PPO | Attending: Internal Medicine

## 2020-01-06 DIAGNOSIS — Z20822 Contact with and (suspected) exposure to covid-19: Secondary | ICD-10-CM

## 2020-01-07 ENCOUNTER — Telehealth: Payer: Self-pay | Admitting: Physician Assistant

## 2020-01-07 LAB — NOVEL CORONAVIRUS, NAA: SARS-CoV-2, NAA: DETECTED — AB

## 2020-01-07 NOTE — Telephone Encounter (Signed)
Patient was contacted regarding positive covid 19 result. Pt's symptoms include . No shortness of breath. We went over CDC guidelines for quarantine and ER precautions.    Angelena Form PA-C  [facility]

## 2020-02-03 ENCOUNTER — Ambulatory Visit: Payer: BC Managed Care – PPO | Admitting: Gastroenterology

## 2020-02-03 ENCOUNTER — Encounter: Payer: Self-pay | Admitting: Gastroenterology

## 2020-02-03 VITALS — BP 108/80 | HR 64 | Temp 98.2°F | Ht 68.5 in | Wt 179.0 lb

## 2020-02-03 DIAGNOSIS — K649 Unspecified hemorrhoids: Secondary | ICD-10-CM

## 2020-02-03 DIAGNOSIS — K625 Hemorrhage of anus and rectum: Secondary | ICD-10-CM | POA: Diagnosis not present

## 2020-02-03 MED ORDER — NA SULFATE-K SULFATE-MG SULF 17.5-3.13-1.6 GM/177ML PO SOLN: 1.0000 | Freq: Once | ORAL | 0 refills | Status: AC

## 2020-02-03 NOTE — Patient Instructions (Addendum)
If you are age 46 or older, your body mass index should be between 23-30. Your Body mass index is 26.82 kg/m. If this is out of the aforementioned range listed, please consider follow up with your Primary Care Provider.  If you are age 42 or younger, your body mass index should be between 19-25. Your Body mass index is 26.82 kg/m. If this is out of the aformentioned range listed, please consider follow up with your Primary Care Provider.   You have been scheduled for a colonoscopy. Please follow written instructions given to you at your visit today.  Please pick up your prep supplies at the pharmacy within the next 1-3 days. If you use inhalers (even only as needed), please bring them with you on the day of your procedure.

## 2020-02-03 NOTE — Progress Notes (Signed)
Phillip [name]    [id number]    [birth year]  Primary Care Physician:Henson, Laurian Brim, NP-C  Referring Physician: Girtha Rm, NP-C [place],  [place] [id number]   Chief complaint:  Hemorrhoids  HPI:  44 year old male here for new patient visit with complaints of anal discomfort and itching.  He also notes occasional bright red blood per rectum when he wipes after bowel movement but denies any blood in stool. No constipation, he has daily soft bowel movement. He never had a colonoscopy. No family history of colon cancer or IBD.  He used over-the-counter hemorrhoidal cream with no improvement, was prescribed ProctoCream by PMD.  He initially had some relief but feels it is no longer working.  Outpatient Encounter Medications as of 02/03/2020  Medication Sig  . pramoxine-hydrocortisone (PROCTOCREAM-HC) 1-1 % rectal cream Place 1 application rectally 2 (two) times daily.   No facility-administered encounter medications on file as of 02/03/2020.    Allergies as of 02/03/2020  . (No Known Allergies)    Past Medical History:  Diagnosis Date  . Medical history non-contributory     Past Surgical History:  Procedure Laterality Date  . ANTERIOR CRUCIATE LIGAMENT REPAIR Left 03/30/2018   Procedure: RECONSTRUCTION LEFT KNEE ANTERIOR CRUCIATE LIGAMENT (ACL) WITH HAMSTRING GRAFT AND MEDIAL MENISCAL RESECTION AND/OR REPAIR;  Surgeon: Meredith Pel, MD;  Location: [facility];  Service: Orthopedics;  Laterality: Left;  . wisdom toothe extraction      Family History  Problem Relation Age of Onset  . Liver disease Father   . Alcoholism Father   . Hypertension Maternal Grandmother     Social History   Socioeconomic History  . Marital status: Married    Spouse name: Not on file  . Number of children: 4  . Years of education: Not on file  . Highest education level: Not on file  Occupational History  . Not on file  Tobacco Use  . Smoking status:  Never Smoker  . Smokeless tobacco: Never Used  Substance and Sexual Activity  . Alcohol use: Yes    Comment: occ  . Drug use: Never  . Sexual activity: Yes  Other Topics Concern  . Not on file  Social History Narrative  . Not on file   Social Determinants of Health   Financial Resource Strain:   . Difficulty of Paying Living Expenses: Not on file  Food Insecurity:   . Worried About Charity fundraiser in the Last Year: Not on file  . Ran Out of Food in the Last Year: Not on file  Transportation Needs:   . Lack of Transportation (Medical): Not on file  . Lack of Transportation (Non-Medical): Not on file  Physical Activity:   . Days of Exercise per Week: Not on file  . Minutes of Exercise per Session: Not on file  Stress:   . Feeling of Stress : Not on file  Social Connections:   . Frequency of Communication with Friends and Family: Not on file  . Frequency of Social Gatherings with Friends and Family: Not on file  . Attends Religious Services: Not on file  . Active Member of Clubs or Organizations: Not on file  . Attends Archivist Meetings: Not on file  . Marital Status: Not on file  Intimate Partner Violence:   . Fear of Current or Ex-Partner: Not on file  . Emotionally Abused: Not on file  . Physically Abused:  Not on file  . Sexually Abused: Not on file      Review of systems:  All other review of systems negative except as mentioned in the HPI.   Physical Exam: Vitals:   02/03/20 0905  BP: 108/80  Pulse: 64  Temp: 98.2 F (36.8 C)   Body mass index is 26.82 kg/m. Gen:      No acute distress Neuro: alert and oriented x 3 Psych: normal mood and affect  Data Reviewed:  Reviewed labs, radiology imaging, old records and pertinent past GI work up   Assessment and Plan/Recommendations:  44 year old male with symptomatic hemorrhoids, intermittent bright red blood per rectum likely etiology small-volume hemorrhage from internal hemorrhoids but  will need to exclude neoplastic lesion Recent change in [place] task force guidelines for colorectal cancer screening, he is due for colonoscopy. We will schedule colonoscopy for colorectal cancer screening, if no other etiology other than hemorrhoids for rectal bleeding, will plan to schedule internal hemorrhoid band ligation for symptomatic hemorrhoids. The risks and benefits as well as alternatives of endoscopic procedure(s) have been discussed and reviewed. All questions answered. The patient agrees to proceed.  Advised patient to avoid excessive straining during defecation.  Avoid moisture in the perianal region to decrease itching.   The patient was provided an opportunity to ask questions and all were answered. The patient agreed with the plan and demonstrated an understanding of the instructions.  Phillip [name] , MD    CC: Girtha Rm, NP-C

## 2020-02-09 ENCOUNTER — Ambulatory Visit: Payer: BC Managed Care – PPO | Attending: Family

## 2020-02-09 DIAGNOSIS — Z23 Encounter for immunization: Secondary | ICD-10-CM | POA: Insufficient documentation

## 2020-02-09 NOTE — Progress Notes (Signed)
   Covid-19 Vaccination Clinic  Name:  Phillip [name]    MRN: [id number] DOB: April 06, 1976  02/09/2020  Phillip [name] was observed post Covid-19 immunization for 15 minutes without incident. He was provided with Vaccine Information Sheet and instruction to access the V-Safe system.   Phillip [name] was instructed to call 911 with any severe reactions post vaccine: Marland Kitchen Difficulty breathing  . [name]  . [name]  . [name]  . Dizziness and weakness   Immunizations Administered    Name Date Dose VIS Date Route   Moderna COVID-19 Vaccine 02/09/2020  2:07 PM 0.5 mL 11/08/2019 Intramuscular   Manufacturer: Moderna   Lot: [id number]   [name]

## 2020-02-17 ENCOUNTER — Encounter: Payer: Self-pay | Admitting: Gastroenterology

## 2020-02-21 ENCOUNTER — Encounter: Payer: Self-pay | Admitting: Gastroenterology

## 2020-02-21 ENCOUNTER — Ambulatory Visit (AMBULATORY_SURGERY_CENTER): Payer: BC Managed Care – PPO | Admitting: Gastroenterology

## 2020-02-21 ENCOUNTER — Other Ambulatory Visit: Payer: Self-pay

## 2020-02-21 VITALS — BP 105/65 | HR 73 | Temp 96.7°F | Resp 20 | Ht 68.5 in | Wt 179.0 lb

## 2020-02-21 DIAGNOSIS — K649 Unspecified hemorrhoids: Secondary | ICD-10-CM

## 2020-02-21 DIAGNOSIS — Z1211 Encounter for screening for malignant neoplasm of colon: Secondary | ICD-10-CM | POA: Diagnosis not present

## 2020-02-21 DIAGNOSIS — D123 Benign neoplasm of transverse colon: Secondary | ICD-10-CM

## 2020-02-21 MED ORDER — SODIUM CHLORIDE 0.9 % IV SOLN: 500.0000 mL | Freq: Once | INTRAVENOUS | Status: DC

## 2020-02-21 NOTE — Progress Notes (Signed)
PT taken to PACU. Monitors in place. VSS. Report given to RN. 

## 2020-02-21 NOTE — Op Note (Signed)
[facility] Patient Name: Phillip [name] Procedure Date: 02/21/2020 11:21 AM MRN: [id number] Endoscopist: Mauri Pole , MD Age: 44 Referring MD:  Date of Birth: [birth year] Gender: Male Account #: [id number] Procedure:                Colonoscopy Indications:              Screening for colorectal malignant neoplasm Medicines:                Monitored Anesthesia Care Procedure:                Pre-Anesthesia Assessment:                           - Prior to the procedure, a History and Physical                            was performed, and patient medications and                            allergies were reviewed. The patient's tolerance of                            previous anesthesia was also reviewed. The risks                            and benefits of the procedure and the sedation                            options and risks were discussed with the patient.                            All questions were answered, and informed consent                            was obtained. Prior Anticoagulants: The patient has                            taken no previous anticoagulant or antiplatelet                            agents. ASA Grade Assessment: II - A patient with                            mild systemic disease. After reviewing the risks                            and benefits, the patient was deemed in                            satisfactory condition to undergo the procedure.                           After obtaining informed consent, the colonoscope  was passed under direct vision. Throughout the                            procedure, the patient's blood pressure, pulse, and                            oxygen saturations were monitored continuously. The                            Colonoscope was introduced through the anus and                            advanced to the the cecum, identified by                            appendiceal orifice and  ileocecal valve. The                            colonoscopy was performed without difficulty. The                            patient tolerated the procedure well. The quality                            of the bowel preparation was excellent. The                            ileocecal valve, appendiceal orifice, and rectum                            were photographed. Scope In: 11:22:53 AM Scope Out: 11:35:11 AM Scope Withdrawal Time: 0 hours 9 minutes 53 seconds  Total Procedure Duration: 0 hours 12 minutes 18 seconds  Findings:                 The perianal and digital rectal examinations were                            normal.                           A less than 1 mm polyp was found in the transverse                            colon. The polyp was sessile. The polyp was removed                            with a cold biopsy forceps. Resection and retrieval                            were complete.                           Scattered small-mouthed diverticula were found in  the sigmoid colon and descending colon.                           Non-bleeding internal hemorrhoids were found during                            retroflexion. The hemorrhoids were small.                           The exam was otherwise without abnormality. Complications:            No immediate complications. Estimated Blood Loss:     Estimated blood loss was minimal. Impression:               - One less than 1 mm polyp in the transverse colon,                            removed with a cold biopsy forceps. Resected and                            retrieved.                           - Diverticulosis in the sigmoid colon and in the                            descending colon.                           - Non-bleeding internal hemorrhoids.                           - The examination was otherwise normal. Recommendation:           - Patient has a contact number available for                             emergencies. The signs and symptoms of potential                            delayed complications were discussed with the                            patient. Return to normal activities tomorrow.                            Written discharge instructions were provided to the                            patient.                           - Resume previous diet.                           - Continue present medications.                           -  Await pathology results.                           - Repeat colonoscopy in 5-10 years for surveillance                            based on pathology results. Mauri Pole, MD 02/21/2020 11:38:34 AM This report has been signed electronically.

## 2020-02-21 NOTE — Patient Instructions (Signed)
Discharge instructions given. Handouts on polyps,diverticulosis and Hemorrhoids. Resume previous medications. YOU HAD AN ENDOSCOPIC PROCEDURE TODAY AT THE [facility]:   Refer to the procedure report that was given to you for any specific questions about what was found during the examination.  If the procedure report does not answer your questions, please call your gastroenterologist to clarify.  If you requested that your care partner not be given the details of your procedure findings, then the procedure report has been included in a sealed envelope for you to review at your convenience later.  YOU SHOULD EXPECT: Some feelings of bloating in the abdomen. Passage of more gas than usual.  Walking can help get rid of the air that was put into your GI tract during the procedure and reduce the bloating. If you had a lower endoscopy (such as a colonoscopy or flexible sigmoidoscopy) you may notice spotting of blood in your stool or on the toilet paper. If you underwent a bowel prep for your procedure, you may not have a normal bowel movement for a few days.  Please Note:  You might notice some irritation and congestion in your nose or some drainage.  This is from the oxygen used during your procedure.  There is no need for concern and it should clear up in a day or so.  SYMPTOMS TO REPORT IMMEDIATELY:  Following lower endoscopy (colonoscopy or flexible sigmoidoscopy):  Excessive amounts of blood in the stool  Significant tenderness or worsening of abdominal pains  Swelling of the abdomen that is new, acute  Fever of 100F or higher   For urgent or emergent issues, a gastroenterologist can be reached at any hour by calling [phone]. Do not use MyChart messaging for urgent concerns.    DIET:  We do recommend a small meal at first, but then you may proceed to your regular diet.  Drink plenty of fluids but you should avoid alcoholic beverages for 24 hours.  ACTIVITY:  You should  plan to take it easy for the rest of today and you should NOT DRIVE or use heavy machinery until tomorrow (because of the sedation medicines used during the test).    FOLLOW UP: Our staff will call the number listed on your records 48-72 hours following your procedure to check on you and address any questions or concerns that you may have regarding the information given to you following your procedure. If we do not reach you, we will leave a message.  We will attempt to reach you two times.  During this call, we will ask if you have developed any symptoms of COVID 19. If you develop any symptoms (ie: fever, flu-like symptoms, shortness of breath, cough etc.) before then, please call [phone].  If you test positive for Covid 19 in the 2 weeks post procedure, please call and report this information to us.    If any biopsies were taken you will be contacted by phone or by letter within the next 1-3 weeks.  Please call us at [phone] if you have not heard about the biopsies in 3 weeks.    SIGNATURES/CONFIDENTIALITY: You and/or your care partner have signed paperwork which will be entered into your electronic medical record.  These signatures attest to the fact that that the information above on your After Visit Summary has been reviewed and is understood.  Full responsibility of the confidentiality of this discharge information lies with you and/or your care-partner.   

## 2020-02-21 NOTE — Progress Notes (Signed)
Temp by LC, vitals by CW

## 2020-02-21 NOTE — Progress Notes (Signed)
Called to room to assist during endoscopic procedure.  Patient ID and intended procedure confirmed with present staff. Received instructions for my participation in the procedure from the performing physician.  

## 2020-02-23 ENCOUNTER — Emergency Department (HOSPITAL_COMMUNITY): Payer: BC Managed Care – PPO

## 2020-02-23 ENCOUNTER — Telehealth: Payer: Self-pay | Admitting: *Deleted

## 2020-02-23 ENCOUNTER — Other Ambulatory Visit: Payer: Self-pay

## 2020-02-23 ENCOUNTER — Emergency Department (HOSPITAL_COMMUNITY)
Admission: EM | Admit: 2020-02-23 | Discharge: 2020-02-23 | Disposition: A | Discharge status: Home / Self Care | Payer: BC Managed Care – PPO | Attending: Emergency Medicine | Admitting: Emergency Medicine

## 2020-02-23 ENCOUNTER — Encounter (HOSPITAL_COMMUNITY): Payer: Self-pay

## 2020-02-23 DIAGNOSIS — Z9889 Other specified postprocedural states: Secondary | ICD-10-CM | POA: Diagnosis not present

## 2020-02-23 DIAGNOSIS — R0602 Shortness of breath: Secondary | ICD-10-CM

## 2020-02-23 LAB — RAPID URINE DRUG SCREEN, HOSP PERFORMED
Amphetamines: NOT DETECTED
Barbiturates: NOT DETECTED
Benzodiazepines: NOT DETECTED
Cocaine: NOT DETECTED
Opiates: NOT DETECTED
Tetrahydrocannabinol: POSITIVE — AB

## 2020-02-23 LAB — CBC
HCT: 42.3 % (ref 39.0–52.0)
Hemoglobin: 13.8 g/dL (ref 13.0–17.0)
MCH: 29.4 pg (ref 26.0–34.0)
MCHC: 32.6 g/dL (ref 30.0–36.0)
MCV: 90.2 fL (ref 80.0–100.0)
Platelets: 316 10*3/uL (ref 150–400)
RBC: 4.69 MIL/uL (ref 4.22–5.81)
RDW: 12.6 % (ref 11.5–15.5)
WBC: 8 10*3/uL (ref 4.0–10.5)
nRBC: 0 % (ref 0.0–0.2)

## 2020-02-23 LAB — URINALYSIS, ROUTINE W REFLEX MICROSCOPIC
Bilirubin Urine: NEGATIVE
Glucose, UA: 150 mg/dL — AB
Hgb urine dipstick: NEGATIVE
Ketones, ur: NEGATIVE mg/dL
Leukocytes,Ua: NEGATIVE
Nitrite: NEGATIVE
Protein, ur: NEGATIVE mg/dL
Specific Gravity, Urine: 1.017 (ref 1.005–1.030)
pH: 6 (ref 5.0–8.0)

## 2020-02-23 LAB — BASIC METABOLIC PANEL
Anion gap: 12 (ref 5–15)
BUN: 13 mg/dL (ref 6–20)
CO2: 25 mmol/L (ref 22–32)
Calcium: 9.5 mg/dL (ref 8.9–10.3)
Chloride: 103 mmol/L (ref 98–111)
Creatinine, Ser: 1.48 mg/dL — ABNORMAL HIGH (ref 0.61–1.24)
GFR calc Af Amer: >60 mL/min (ref >60)
GFR calc non Af Amer: 56 mL/min — ABNORMAL LOW (ref >60)
Glucose, Bld: 220 mg/dL — ABNORMAL HIGH (ref 70–99)
Potassium: 4.1 mmol/L (ref 3.5–5.1)
Sodium: 140 mmol/L (ref 135–145)

## 2020-02-23 LAB — CBG MONITORING, ED: Glucose-Capillary: 160 mg/dL — ABNORMAL HIGH (ref 70–99)

## 2020-02-23 LAB — ETHANOL: Alcohol, Ethyl (B): <10 mg/dL (ref <10)

## 2020-02-23 LAB — HEMOGLOBIN A1C
Hgb A1c MFr Bld: 5.4 % (ref 4.8–5.6)
Mean Plasma Glucose: 108.28 mg/dL

## 2020-02-23 MED ORDER — SODIUM CHLORIDE 0.9 % IV BOLUS (SEPSIS)
500.0000 mL | Freq: Once | INTRAVENOUS | Status: AC
  Administered 2020-02-23: 10:00:00 500 mL via INTRAVENOUS

## 2020-02-23 MED ORDER — SODIUM CHLORIDE 0.9 % IV SOLN: 1000.0000 mL | INTRAVENOUS | Status: DC

## 2020-02-23 NOTE — Telephone Encounter (Signed)
Attempted second f/u phone call. No answer. Left message.  

## 2020-02-23 NOTE — ED Notes (Signed)
Pt d/c home per MD order. Discharge summary reviewed with pt, pt verbalizes understanding. Off unit via [facility] with nursing staff. Pt wife discharge ride home.

## 2020-02-23 NOTE — ED Provider Notes (Signed)
[facility] EMERGENCY DEPARTMENT Provider Note   CSN: [id number] Arrival date & time: 02/23/20  0756     History Chief Complaint  Patient presents with  . Shortness of Breath    Phillip [name] is a 44 y.o. male.  HPI  44 year old male with a history presents to the ER with shortness of breath and "dehydration".  He woke up around 645 this morning with shortness of breath and reports a dry mouth.  He reports "muscle spasms" in his chest and face.  He woke up around 3 AM this morning and had difficulty urinating but did not think much about it.  He does not have any known problems with urination previously.  He woke up at 645 he called his wife and she brought him to the ER for his SOB.  He reports one episode of nausea/vomiting on the way to the ER.  He does not take any medications.  He did have a colonoscopy 2 days ago but no other major surgeries or travel. No personal or family history of cardiac issues. No history of autoimmune disease.  Denies drug use, smoking, states his last use of alcohol was 2 weeks ago.  Patient tested positive for Covid 2 months ago but was unaware of diagnosis per wife.  She states the county never contacted him after the positive test.  Denies chest pain, palpitations, cough, wheezing, syncope, shoulder pain,  hematemesis, hematuria, diarrhea    History of digestion issues  Normally  Past Medical History:  Diagnosis Date  . Medical history non-contributory     Patient Active Problem List   Diagnosis Date Noted  . Left anterior cruciate ligament tear 01/26/2018  . Tear of medial meniscus of left knee, current 01/26/2018  . Closed fracture of left tibial plateau 01/11/2018    Past Surgical History:  Procedure Laterality Date  . ANTERIOR CRUCIATE LIGAMENT REPAIR Left 03/30/2018   Procedure: RECONSTRUCTION LEFT KNEE ANTERIOR CRUCIATE LIGAMENT (ACL) WITH HAMSTRING GRAFT AND MEDIAL MENISCAL RESECTION AND/OR REPAIR;  Surgeon: Meredith Pel, MD;  Location: [facility];  Service: Orthopedics;  Laterality: Left;  . wisdom toothe extraction         Family History  Problem Relation Age of Onset  . Liver disease Father   . Alcoholism Father   . Hypertension Maternal Grandmother   . Colon cancer Neg Hx   . Esophageal cancer Neg Hx   . Rectal cancer Neg Hx   . Stomach cancer Neg Hx     Social History   Tobacco Use  . Smoking status: Never Smoker  . Smokeless tobacco: Never Used  Substance Use Topics  . Alcohol use: Yes    Comment: occ  . Drug use: Never    Home Medications Prior to Admission medications   Medication Sig Start Date End Date Taking? Authorizing Provider  pramoxine-hydrocortisone (PROCTOCREAM-HC) 1-1 % rectal cream Place 1 application rectally 2 (two) times daily. Patient not taking: Reported on 02/23/2020 01/04/20   Girtha Rm, NP-C    Allergies    Patient has no known allergies.  Review of Systems   Review of Systems  Constitutional: Negative for appetite change, chills, diaphoresis, fatigue and fever.  Respiratory: Positive for shortness of breath. Negative for cough, chest tightness, wheezing and stridor.   Cardiovascular: Negative for chest pain, palpitations and leg swelling.  Gastrointestinal: Positive for nausea and vomiting. Negative for abdominal pain, constipation and diarrhea.  Endocrine: Negative.   Genitourinary: Positive for difficulty urinating.  Negative for dysuria, flank pain, frequency and hematuria.  Musculoskeletal: Negative for arthralgias.  All other systems reviewed and are negative.   Physical Exam Updated Vital Signs BP (!) 142/81   Pulse 64   Temp (!) 97.5 F (36.4 C) (Oral)   Resp 16   SpO2 100%   Physical Exam Constitutional:      Comments: Patient resting in bed with eyes closed, not opening his eyes as we are conversing.  HENT:     Head: Normocephalic and atraumatic.  Eyes:     Extraocular Movements: Extraocular movements intact.      Pupils: Pupils are equal, round, and reactive to light.  Cardiovascular:     Rate and Rhythm: Normal rate and regular rhythm.  Pulmonary:     Effort: Tachypnea present. No respiratory distress.     Breath sounds: Normal breath sounds. No decreased breath sounds or wheezing.  Chest:     Chest wall: No tenderness.  Abdominal:     Tenderness: There is abdominal tenderness. There is guarding.     Comments: Generalized abdominal tenderness  Musculoskeletal:        General: Normal range of motion.     Cervical back: Normal range of motion.  Skin:    General: Skin is warm and dry.  Neurological:     Comments: Patient appears lethargic  Psychiatric:     Comments: Patient with appears lethargic, slow to respond to questions.     ED Results / Procedures / Treatments   Labs (all labs ordered are listed, but only abnormal results are displayed) Labs Reviewed  BASIC METABOLIC PANEL - Abnormal; Notable for the following components:      Result Value   Glucose, Bld 220 (*)    Creatinine, Ser 1.48 (*)    GFR calc non Af Amer 56 (*)    All other components within normal limits  URINALYSIS, ROUTINE W REFLEX MICROSCOPIC - Abnormal; Notable for the following components:   Glucose, UA 150 (*)    All other components within normal limits  RAPID URINE DRUG SCREEN, HOSP PERFORMED - Abnormal; Notable for the following components:   Tetrahydrocannabinol POSITIVE (*)    All other components within normal limits  CBG MONITORING, ED - Abnormal; Notable for the following components:   Glucose-Capillary 160 (*)    All other components within normal limits  CBC  ETHANOL  HEMOGLOBIN A1C    EKG EKG Interpretation  Date/Time:  Thursday February 23 2020 08:22:57 EDT Ventricular Rate:  67 PR Interval:    QRS Duration: 99 QT Interval:  406 QTC Calculation: 429 R Axis:   46 Text Interpretation: Sinus rhythm Borderline ST elevation, lateral leads No acute changes No significant change since last  tracing Confirmed by Varney Biles [id number]) on 02/23/2020 8:40:03 AM   Radiology DG Chest Port 1 View  Result Date: 02/23/2020 CLINICAL DATA:  Shortness of breath. EXAM: PORTABLE CHEST 1 VIEW COMPARISON:  11/02/2015 FINDINGS: 0843 hours. The lungs are clear without focal pneumonia, edema, pneumothorax or pleural effusion. Cardiopericardial silhouette is at upper limits of normal for size. The visualized bony structures of the thorax are intact. Telemetry leads overlie the chest. IMPRESSION: No active disease. Electronically Signed   By: Misty Stanley M.D.   On: 02/23/2020 08:51    Procedures Procedures (including critical care time)  Medications Ordered in ED Medications  sodium chloride 0.9 % bolus 500 mL (0 mLs Intravenous Stopped 02/23/20 1100)    Followed by  0.9 %  sodium  chloride infusion (has no administration in time range)    ED Course  I have reviewed the triage vital signs and the nursing notes.  Pertinent labs & imaging results that were available during my care of the patient were reviewed by me and considered in my medical decision making (see chart for details).  Clinical Course as of Feb 23 1112  Thu Feb 23, 2020  1023 On further discussion with the wife, she reports reports that when the patient woke up this morning it appeared that he was having a panic attack.  She states that he was breathing fast and it was hard for her to get him focus.  Patient is resting comfortably in room, watching YouTube on his phone.  He was able to urinate 500 cc in the room per note per nursing   [MB]  1030 Labs reassuring.  Mildly elevated glucose,, glucose urea.  Positive for THC.  Chest pain EKG negative.  Patient was able to urinate in the room, approximately 500 cc per nursing.  He states he needs to urinate again.  We will BladderScan again post void, if appropriate patient is stable for discharge.   [MB]  1043 250 cc on the bladder scanner post void.   [MB]    Clinical Course User  Index [MB] Lyndel Safe   MDM Rules/Calculators/A&P                      44 year old male with no significant medical history presents to the ED for shortness of breath and difficulty urinating -DDX: MI, PE, pneumonia, COVID, pneumothorax, hypoglycemia/hyperglycemia, bladder spasm, BPH,  -Mildly tachycardic on arrival to ED, vitals stable in ED, unchanged. -EKG and chest x-ray normal -Labs show increased glucose at 220, glucose on UA.  No UTI.  Positive for THC.  Negative for alcohol.  CBC unremarkable. -Doubt MI due to no chest pain, EKG reassuring.  Doubt PE, patient not tachycardic, O2 sats okay in the ED.  Patient tested positive for Covid 2 months ago and received first vaccine a few weeks ago.  Pneumothorax ruled out with chest x-ray.  Labs show mild to moderate hyperglycemia, no ketones on UA -  Discussed case with Dr. Kathrynn Humble. Per conversation per wife, patient's shortness of breath likely secondary to a panic attack.  Urinary retention likely due to colonoscopy few days ago.  Patient was able to void successfully twice in ER, first void was 500 cc in the urinal.  Last bladder scan post void 250 cc.  Patient instructed to keep a close eye on voiding patterns.  Return precautions given.  Suggest follow-up with PCP for lab work.  Discussed plan with patient and wife, they are agreeable to this plan.  Final Clinical Impression(s) / ED Diagnoses Final diagnoses:  SOB (shortness of breath)    Rx / DC Orders ED Discharge Orders    None       Lyndel Safe 02/23/20 [place], MD 02/27/20 1616

## 2020-02-23 NOTE — ED Notes (Signed)
Urinal provided, pt encouraged to provide sample per MD order.

## 2020-02-23 NOTE — ED Triage Notes (Signed)
Pt c.o feeling sob when he woke up this morning. Resp e.u at this time. Had a recent colonoscopy. Hx of COVID 2 months ago.

## 2020-02-23 NOTE — Discharge Instructions (Addendum)
You were seen in the ER for shortness of breath and difficulty urinating.  Your lab tests showed increased blood sugar and slightly decreased kidney function.  You should follow-up with your primary care provider for these tests.  Your chest x-ray showed no signs of infection. You were able to successfully urinate in the ED and your bladder scan showed that you are able to void successfully.  Your difficulty urinating symptoms are likely due to your recent colonoscopy.  Make sure to keep a close eye and if you continue to have difficulty urinating or your symptoms get worse, make sure to come back to the ED.

## 2020-02-23 NOTE — Telephone Encounter (Signed)
Attempted f/u phone call. No answer. Left message. °

## 2020-02-28 ENCOUNTER — Encounter: Payer: Self-pay | Admitting: Gastroenterology

## 2020-03-13 ENCOUNTER — Ambulatory Visit: Payer: BC Managed Care – PPO | Attending: Family

## 2020-03-13 DIAGNOSIS — Z23 Encounter for immunization: Secondary | ICD-10-CM

## 2020-03-13 NOTE — Progress Notes (Signed)
   Covid-19 Vaccination Clinic  Name:  Phillip [name]    MRN: [id number] DOB: August 12, 1976  03/13/2020  [name] was observed post Covid-19 immunization for 15 minutes without incident. He was provided with Vaccine Information Sheet and instruction to access the V-Safe system.   [name] was instructed to call 911 with any severe reactions post vaccine: Marland Kitchen Difficulty breathing  . [name]  . [name]  . [name]  . Dizziness and weakness   Immunizations Administered    Name Date Dose VIS Date Route   Moderna COVID-19 Vaccine 03/13/2020  1:13 PM 0.5 mL 11/08/2019 Intramuscular   Manufacturer: Moderna   Lot: [id number]   [id number]

## 2020-03-15 ENCOUNTER — Telehealth: Payer: Self-pay | Admitting: Gastroenterology

## 2020-03-15 NOTE — Telephone Encounter (Signed)
Spoke with the patient. States he is not worsening. His symptoms are persistent of burning and itching. He is trying not to sit for extended periods of time. He is avoiding hard stools.  Hemorrhoid banding was discussed in his office visit. He has had the colonoscopy. I have scheduled him for a banding. Encouraged him to visit the website for the banding system we use. He did not have any other questions or concerns. Declines any rx for suppository or creams.

## 2020-03-21 NOTE — Telephone Encounter (Signed)
Pt called back and stated that he is still experiencing painful hemorrhoids.  He requested a sooner banding appt.

## 2020-03-22 NOTE — Telephone Encounter (Signed)
Spoke with pt and let him know there are not any sooner appt but we have added him to the wait list for a sooner appt if someone cancels. Pt aware.

## 2020-03-30 ENCOUNTER — Encounter: Payer: BC Managed Care – PPO | Admitting: Gastroenterology

## 2020-04-02 ENCOUNTER — Ambulatory Visit: Payer: BC Managed Care – PPO | Admitting: Gastroenterology

## 2020-04-02 ENCOUNTER — Encounter: Payer: Self-pay | Admitting: Gastroenterology

## 2020-04-02 VITALS — BP 124/84 | HR 99 | Temp 97.5°F | Ht 68.5 in | Wt 170.0 lb

## 2020-04-02 DIAGNOSIS — K641 Second degree hemorrhoids: Secondary | ICD-10-CM | POA: Diagnosis not present

## 2020-04-02 NOTE — Progress Notes (Signed)
PROCEDURE NOTE: The patient presents with symptomatic grade II  hemorrhoids, requesting rubber band ligation of his/her hemorrhoidal disease.  All risks, benefits and alternative forms of therapy were described and informed consent was obtained.  In the Left Lateral Decubitus position anoscopic examination revealed grade II hemorrhoids in the  Left lateral position and grade 1 hemorrhoids in the right anterior and right posterior positions.  The anorectum was pre-medicated with 0.125% nitroglycerin and RectiCare The decision was made to band the left lateral internal hemorrhoid, and the [place] was used to perform band ligation without complication.  Digital anorectal examination was then performed to assure proper positioning of the band, and to adjust the banded tissue as required.  The patient was discharged home without pain or other issues.  Dietary and behavioral recommendations were given and along with follow-up instructions.      The patient will return as needed for  follow-up and possible additional banding. No complications were encountered and the patient tolerated the procedure well.  [name] , MD [phone]

## 2020-04-02 NOTE — Patient Instructions (Signed)
If you are age 46 or older, your body mass index should be between 23-30. Your Body mass index is 25.47 kg/m. If this is out of the aforementioned range listed, please consider follow up with your Primary Care Provider.  If you are age 24 or younger, your body mass index should be between 19-25. Your Body mass index is 25.47 kg/m. If this is out of the aformentioned range listed, please consider follow up with your Primary Care Provider.    HEMORRHOID BANDING PROCEDURE    FOLLOW-UP CARE   1. The procedure you have had should have been relatively painless since the banding of the area involved does not have nerve endings and there is no pain sensation.  The rubber band cuts off the blood supply to the hemorrhoid and the band may fall off as soon as 48 hours after the banding (the band may occasionally be seen in the toilet bowl following a bowel movement). You may notice a temporary feeling of fullness in the rectum which should respond adequately to plain Tylenol or Motrin.  2. Following the banding, avoid strenuous exercise that evening and resume full activity the next day.  A sitz bath (soaking in a warm tub) or bidet is soothing, and can be useful for cleansing the area after bowel movements.     3. To avoid constipation, take two tablespoons of natural wheat bran, natural oat bran, flax, Benefiber or any over the counter fiber supplement and increase your water intake to 7-8 glasses daily.    4. Unless you have been prescribed anorectal medication, do not put anything inside your rectum for two weeks: No suppositories, enemas, fingers, etc.  5. Occasionally, you may have more bleeding than usual after the banding procedure.  This is often from the untreated hemorrhoids rather than the treated one.  Don't be concerned if there is a tablespoon or so of blood.  If there is more blood than this, lie flat with your bottom higher than your head and apply an ice pack to the area. If the  bleeding does not stop within a half an hour or if you feel faint, call our office at [phone] or go to the emergency room.  6. Problems are not common; however, if there is a substantial amount of bleeding, severe pain, chills, fever or difficulty passing urine (very rare) or other problems, you should call us at [phone] or report to the nearest emergency room.  7. Do not stay seated continuously for more than 2-3 hours for a day or two after the procedure.  Tighten your buttock muscles 10-15 times every two hours and take 10-15 deep breaths every 1-2 hours.  Do not spend more than a few minutes on the toilet if you cannot empty your bowel; instead re-visit the toilet at a later time.   Follow up with [place] as needed.    Thank you for choosing me and [facility].  Dr.Nandigam

## 2020-06-22 ENCOUNTER — Telehealth: Payer: Self-pay | Admitting: Gastroenterology

## 2020-06-22 NOTE — Telephone Encounter (Signed)
Spoke with patient. Reports he has a protrusion at the rectum. Feels rectal discomfort, stinging and itching which is worse at times. Denies any constipation.He is using wipes. He has been using a hydrocortisone cream for about a month. Sees blood on the wipes after cleaning. He did try using tissue but this did not work for him. Banding procedure 04/02/20 Colonoscopy was 02/21/20

## 2020-06-22 NOTE — Telephone Encounter (Signed)
Ok, thanks.

## 2020-06-22 NOTE — Telephone Encounter (Addendum)
Patient scheduled for 07/25/20 for possible banding. Will also have him on the cancellation list for an earlier appointment.

## 2020-06-22 NOTE — Telephone Encounter (Signed)
Thank you Beth

## 2020-07-02 ENCOUNTER — Telehealth: Payer: Self-pay | Admitting: Gastroenterology

## 2020-07-02 NOTE — Telephone Encounter (Signed)
Please advise 

## 2020-07-02 NOTE — Telephone Encounter (Signed)
Ok to send the letter, I will generate it

## 2020-07-02 NOTE — Telephone Encounter (Signed)
Patient called and he is requesting a letter for work to see if they can get him on a standing desk to help with his hemorrhoids.

## 2020-07-03 ENCOUNTER — Encounter: Payer: Self-pay | Admitting: Gastroenterology

## 2020-07-03 NOTE — Telephone Encounter (Signed)
Letter printed will call patient to pick it up     Patient wants to call us back tomorrow to have letter faxed to his work  Will call back with number

## 2020-07-03 NOTE — Telephone Encounter (Signed)
Thank you. I will be out the rest of the week if you want to give it to Shirlean Mylar to take care of.

## 2020-07-03 NOTE — Telephone Encounter (Signed)
Done, thanks

## 2020-07-25 ENCOUNTER — Encounter: Payer: BC Managed Care – PPO | Admitting: Gastroenterology

## 2020-07-26 ENCOUNTER — Encounter: Payer: Self-pay | Admitting: Gastroenterology

## 2020-07-26 ENCOUNTER — Ambulatory Visit (INDEPENDENT_AMBULATORY_CARE_PROVIDER_SITE_OTHER): Payer: BC Managed Care – PPO | Admitting: Gastroenterology

## 2020-07-26 VITALS — BP 102/70 | HR 80 | Ht 67.0 in | Wt 175.0 lb

## 2020-07-26 DIAGNOSIS — K641 Second degree hemorrhoids: Secondary | ICD-10-CM

## 2020-07-26 MED ORDER — AMBULATORY NON FORMULARY MEDICATION: 1 refills | Status: DC

## 2020-07-26 NOTE — Patient Instructions (Signed)
We have sent a prescription for nitroglycerin 0.125% gel to [facility]. You should apply a pea size amount to your rectum three times daily x 6-8 weeks.  [facility]'s information is below: Address: [address] [place], [place] [address]  [phone]  *Please DO NOT go directly from our office to pick up this medication! Give the pharmacy 1 day to process the prescription as this is compounded and takes time to make.  HEMORRHOID BANDING PROCEDURE    FOLLOW-UP CARE   1. The procedure you have had should have been relatively painless since the banding of the area involved does not have nerve endings and there is no pain sensation.  The rubber band cuts off the blood supply to the hemorrhoid and the band may fall off as soon as 48 hours after the banding (the band may occasionally be seen in the toilet bowl following a bowel movement). You may notice a temporary feeling of fullness in the rectum which should respond adequately to plain Tylenol or Motrin.  2. Following the banding, avoid strenuous exercise that evening and resume full activity the next day.  A sitz bath (soaking in a warm tub) or bidet is soothing, and can be useful for cleansing the area after bowel movements.     3. To avoid constipation, take two tablespoons of natural wheat bran, natural oat bran, flax, Benefiber or any over the counter fiber supplement and increase your water intake to 7-8 glasses daily.    4. Unless you have been prescribed anorectal medication, do not put anything inside your rectum for two weeks: No suppositories, enemas, fingers, etc.  5. Occasionally, you may have more bleeding than usual after the banding procedure.  This is often from the untreated hemorrhoids rather than the treated one.  Don't be concerned if there is a tablespoon or so of blood.  If there is more blood than this, lie flat with your bottom higher than your head and apply an ice pack to the area. If the  bleeding does not stop within a half an hour or if you feel faint, call our office at [phone] or go to the emergency room.  6. Problems are not common; however, if there is a substantial amount of bleeding, severe pain, chills, fever or difficulty passing urine (very rare) or other problems, you should call us at [phone] or report to the nearest emergency room.  7. Do not stay seated continuously for more than 2-3 hours for a day or two after the procedure.  Tighten your buttock muscles 10-15 times every two hours and take 10-15 deep breaths every 1-2 hours.  Do not spend more than a few minutes on the toilet if you cannot empty your bowel; instead re-visit the toilet at a later time.    I appreciate the  opportunity to care for you  Thank You   Harl Bowie , MD

## 2020-07-26 NOTE — Progress Notes (Signed)
PROCEDURE NOTE: The patient presents with symptomatic grade II  hemorrhoids, requesting rubber band ligation of his/her hemorrhoidal disease.  All risks, benefits and alternative forms of therapy were described and informed consent was obtained.  In the Left Lateral Decubitus position anoscopic examination revealed grade II hemorrhoids in the R posterior position(s).  The anorectum was pre-medicated with 0.125% NTG The decision was made to band the R posterior internal hemorrhoid, and the Albers was used to perform band ligation without complication.  Digital anorectal examination was then performed to assure proper positioning of the band, and to adjust the banded tissue as required.  The patient was discharged home without pain or other issues.  Dietary and behavioral recommendations were given and along with follow-up instructions.     The following adjunctive treatments were recommended:  NTG 0.125% BID X 2 weeks, small pea size amount per rectum  The patient will return  follow-up and possible additional banding as needed. No complications were encountered and the patient tolerated the procedure well.  [name] , MD [phone]

## 2020-09-12 ENCOUNTER — Telehealth: Payer: Self-pay | Admitting: Gastroenterology

## 2020-09-13 NOTE — Telephone Encounter (Signed)
Please send referral to colorectal surgery to discuss possible hemorrhoidectomy given he is not having improvement of symptoms after hemorrhoidal banding.  Please schedule follow-up visit in office, next available appointment if patient wants to discuss this further.  Thank you

## 2020-09-14 NOTE — Telephone Encounter (Signed)
Left information on his cell phone voicemail. Will wait for the patient's response.

## 2020-09-17 NOTE — Telephone Encounter (Signed)
Patient will consider and let us know if he does want the referral.

## 2020-10-08 NOTE — Telephone Encounter (Signed)
Referral request, office notes, procedure notes and demographics with insurance information faxed to [facility] for an appointment with Dr Leighton Ruff.

## 2020-10-08 NOTE — Telephone Encounter (Signed)
Patient is wanting to proceed with the referral

## 2020-10-15 NOTE — Telephone Encounter (Signed)
Pt is requesting a call back from a nurse, pt states he is not interested in having his surgery done at [facility], pt states he has seen poor reviews and would like other options.

## 2020-10-16 NOTE — Telephone Encounter (Signed)
Please send referral to [facility], colorectal surgery. Thanks

## 2020-10-16 NOTE — Telephone Encounter (Signed)
Spoke with the patient. He is comfortable with a referral to Dr Dannielle Burn and asks that the process be started.

## 2020-10-16 NOTE — Telephone Encounter (Signed)
Please advise on any other surgeons you would refer to. Thanks

## 2021-05-20 ENCOUNTER — Telehealth: Payer: Self-pay | Admitting: Gastroenterology

## 2021-05-20 MED ORDER — AMBULATORY NON FORMULARY MEDICATION: 1 refills | Status: DC

## 2021-05-20 NOTE — Telephone Encounter (Signed)
Nitroglycerin faxed to [facility], Patient will call back to schedule a follow up

## 2021-05-20 NOTE — Telephone Encounter (Signed)
L/M for pt that he needs a follow up appointment and where does he want Nitro sent to..... Has to be compounding pharmacy

## 2021-05-20 NOTE — Telephone Encounter (Signed)
Dr Silverio Decamp, patient is wanting Anusol but says he is in a lot of pain, should he be seen in the office?

## 2021-05-20 NOTE — Telephone Encounter (Signed)
Agree with office visit for evaluation. Please send Rx for NTG 0.125% TID per rectum X 6-8 weeks and advise him to use it along with recticare. He has h/o anal fissure. Thanks

## 2021-05-20 NOTE — Telephone Encounter (Signed)
Inbound call from patient. States in a lot of pain and discomfort. Ask for a refill for Nitroglycerin ointment 0.125%. Pharmacy is [facility] on [address] in [place]. Best contact number [phone]

## 2021-05-21 NOTE — Telephone Encounter (Signed)
Pt called to schedule appt. First available with Dr. Silverio Decamp is on 9/20. Pt stated that he cannot wait that long. He would like to have a banding during that ov. He is requesting an appt asap. Pls call him [phone].

## 2021-05-22 NOTE — Telephone Encounter (Signed)
Please advise patient to continue using NTG small pea size amount per rectum, see if we can bring him sooner for a visit with APP for rectal exam but cannot do banding during that visit. Please have him in come in sooner with me if I have any cancellations. Thanks

## 2021-05-22 NOTE — Telephone Encounter (Signed)
Spoke with the patient. Moved the appointment to an earlier time. Will hopefully be able to get him in here sooner.

## 2021-05-22 NOTE — Telephone Encounter (Signed)
Scheduled an appointment for the patient with Alonza Bogus, PA 06/20/21 at 3:00 pm Called the patient to discuss. No answer. Left a voicemail for the patient asking that he call back to discuss with me.

## 2021-05-22 NOTE — Telephone Encounter (Signed)
Spoke with the patient. He has an appointment on 07/25/21.  He states he has soft bowel movements. He is not constipated and has not been constipated. Reports rectal burning, tiching and a general soreness. What else can be done?

## 2021-05-28 ENCOUNTER — Ambulatory Visit (INDEPENDENT_AMBULATORY_CARE_PROVIDER_SITE_OTHER): Payer: BC Managed Care – PPO | Admitting: Gastroenterology

## 2021-05-28 ENCOUNTER — Encounter: Payer: Self-pay | Admitting: Gastroenterology

## 2021-05-28 VITALS — BP 100/74 | HR 70 | Ht 67.0 in | Wt 171.0 lb

## 2021-05-28 DIAGNOSIS — K6289 Other specified diseases of anus and rectum: Secondary | ICD-10-CM

## 2021-05-28 DIAGNOSIS — K645 Perianal venous thrombosis: Secondary | ICD-10-CM | POA: Diagnosis not present

## 2021-05-28 DIAGNOSIS — K602 Anal fissure, unspecified: Secondary | ICD-10-CM

## 2021-05-28 MED ORDER — AMBULATORY NON FORMULARY MEDICATION: 1 refills | Status: DC

## 2021-05-28 MED ORDER — HYDROCORTISONE (PERIANAL) 2.5 % EX CREA: 1.0000 "application " | TOPICAL_CREAM | Freq: Two times a day (BID) | CUTANEOUS | 1 refills | Status: DC

## 2021-05-28 NOTE — Progress Notes (Signed)
Arlene Genova    [id number]    1976-05-14  Primary Care Physician:Henson, Laurian Brim, NP-C  Referring Physician: Girtha Rm, NP-C [place],  [place] [id number]   Chief complaint:  Rectal pain and bleeding  HPI: 55 yr very pleasant gentleman here for follow up visit with c/o anorectal pain and bleeding.  He has been having on and off symptoms since March April of this year, progressively worse in the past few weeks.  He has significant anorectal pain and rectal bleeding when he wipes after a bowel movement.  He is using nitroglycerin with no improvement.  He is doing soaking baths once daily with some improvement that is transient  He is s/p hemorrhoidal band ligationX2 in 2021  Colonoscopy 02/21/20 - One less than 1 mm polyp in the transverse colon, removed with a cold biopsy forceps. Resected and retrieved. - Diverticulosis in the sigmoid colon and in the descending colon. - Non-bleeding internal hemorrhoids. - The examination was otherwise normal.   Outpatient Encounter Medications as of 05/28/2021  Medication Sig   AMBULATORY NON FORMULARY MEDICATION Medication Name: Nitroglycerin ointment 0.125% Use pea sized amount per rectum x 2 week, continue as needed   No facility-administered encounter medications on file as of 05/28/2021.    Allergies as of 05/28/2021   (No Known Allergies)    Past Medical History:  Diagnosis Date   Internal hemorrhoids    Medical history non-contributory     Past Surgical History:  Procedure Laterality Date   ANTERIOR CRUCIATE LIGAMENT REPAIR Left 03/30/2018   Procedure: RECONSTRUCTION LEFT KNEE ANTERIOR CRUCIATE LIGAMENT (ACL) WITH HAMSTRING GRAFT AND MEDIAL MENISCAL RESECTION AND/OR REPAIR;  Surgeon: Meredith Pel, MD;  Location: [facility];  Service: Orthopedics;  Laterality: Left;   HEMORRHOID BANDING     wisdom toothe extraction      Family History  Problem Relation Age of Onset   Liver disease  Father    Alcoholism Father    Hypertension Maternal Grandmother    Colon cancer Neg Hx    Esophageal cancer Neg Hx    Rectal cancer Neg Hx    Stomach cancer Neg Hx     Social History   Socioeconomic History   Marital status: Married    Spouse name: Not on file   Number of children: 4   Years of education: Not on file   Highest education level: Not on file  Occupational History   Occupation: [employer]  Tobacco Use   Smoking status: Never   Smokeless tobacco: Never  Vaping Use   Vaping Use: Never used  Substance and Sexual Activity   Alcohol use: Yes    Comment: occ   Drug use: Never   Sexual activity: Yes  Other Topics Concern   Not on file  Social History Narrative   Not on file   Social Determinants of Health   Financial Resource Strain: Not on file  Food Insecurity: Not on file  Transportation Needs: Not on file  Physical Activity: Not on file  Stress: Not on file  Social Connections: Not on file  Intimate Partner Violence: Not on file      Review of systems: All other review of systems negative except as mentioned in the HPI.   Physical Exam: Vitals:   05/28/21 1358  BP: 100/74  Pulse: 70   Body mass index is 26.78 kg/m. Gen:      No acute distress HEENT:  sclera anicteric Abd:      soft, non-tender; no palpable masses, no distension Ext:    No edema Neuro: alert and oriented x 3 Psych: normal mood and affect Rectal exam: Thrombosed left lateral external hemorrhoid, increased anal sphincter tone + anal fissure Anoscopy: Unable to do adequate exam due to significant pain  Data Reviewed:  Reviewed labs, radiology imaging, old records and pertinent past GI work up   Assessment and Plan/Recommendations:  45 year old male with history of symptomatic hemorrhoids s/p internal hemorrhoid band ligation in 2021 with complaints of anorectal pain and bleeding Anal fissure and thrombosed external hemorrhoid Will send urgent referral to [facility] to lance the  thrombosed external hemorrhoid Use Anusol cream twice daily and sitz bath Avoid excessive straining during defecation  Anal fissure: Use nitroglycerin 0.125% 3 times daily for additional 6 to 8 weeks  Patient may benefit from hemorrhoidectomy given his ongoing issues with hemorrhoids, advised patient that it can be scheduled for later once his acute issues resolve  Colorectal cancer screening: Average risk due for recall colonoscopy in March 2031   This visit required 40 minutes of patient care (this includes precharting, chart review, review of results, face-to-face time used for counseling as well as treatment plan and follow-up. The patient was provided an opportunity to ask questions and all were answered. The patient agreed with the plan and demonstrated an understanding of the instructions.  [name] , MD    CC: Girtha Rm, NP-C

## 2021-05-28 NOTE — Patient Instructions (Addendum)
We have sent a referral over to [facility] for a consult of your thrombosed hemorrhoids     05/30/2021 at 10:00am, arrive at 9:30am  We have sent a prescription for nitroglycerin 0.125% gel to [facility]. You should apply a pea size amount to your rectum three times daily x 6-8 weeks.  [facility]'s information is below: Address: [address] [place], [place] [address]  [phone]  *Please DO NOT go directly from our office to pick up this medication! Give the pharmacy 1 day to process the prescription as this is compounded and takes time to make.   We will send Anusol Cream to your pharmacy  Use Fiber supplements daily  How to Take a Sitz Bath A sitz bath is a warm water bath that may be used to care for your rectum, genital area, or the area between your rectum and genitals (perineum). In a sitz bath, the water only comes up to your hips and covers your buttocks. A sitz bath may be done in a bathtub or with a portable sitz baththat fits over the toilet. Your health care provider may recommend a sitz bath to help: Relieve pain and discomfort after delivering a baby. Relieve pain and itching from hemorrhoids or anal fissures. Relieve pain after certain surgeries. Relax muscles that are sore or tight. How to take a sitz bath Take 3-4 sitz baths a day, or as many as told by your health care provider. Bathtub sitz bath To take a sitz bath in a bathtub: Partially fill a bathtub with warm water. The water should be deep enough to cover your hips and buttocks when you are sitting in the tub. Follow your health care provider's instructions if you are told to put medicine in the water. Sit in the water. Open the tub drain a little, and leave it open during your bath. Turn on the warm water again, enough to replace the water that is draining out. Keep the water running throughout your bath. This helps keep the water at the right level and temperature. Soak in the water for  15-20 minutes, or as long as told by your health care provider. When you are done, be careful when you stand up. You may feel dizzy. After the sitz bath, pat yourself dry. Do not rub your skin to dry it.  Over-the-toilet sitz bath To take a sitz bath with an over-the-toilet basin: Follow the manufacturer's instructions. Fill the basin with warm water. Follow your health care provider's instructions if you were told to put medicine in the water. Sit on the seat. Make sure the water covers your buttocks and perineum. Soak in the water for 15-20 minutes, or as long as told by your health care provider. After the sitz bath, pat yourself dry. Do not rub your skin to dry it. Clean and dry the basin between uses. Discard the basin if it cracks, or according to the manufacturer's instructions.  Contact a health care provider if: Your pain or itching gets worse. Do not continue with sitz baths if your symptoms get worse. You have new symptoms. Do not continue with sitz baths until you talk with your health care provider. Summary A sitz bath is a warm water bath in which the water only comes up to your hips and covers your buttocks. A sitz bath may help relieve pain and discomfort after delivering a baby. It also may help with pain and itching from hemorrhoids or anal fissures, or pain after certain  surgeries. It can also help to relax muscles that are sore or tight. Take 3-4 sitz baths a day, or as many as told by your health care provider. Soak in the water for 15-20 minutes. Do not continue with sitz baths if your symptoms get worse. This information is not intended to replace advice given to you by your health care provider. Make sure you discuss any questions you have with your healthcare provider. Document Revised: 08/09/2020 Document Reviewed: 08/09/2020 Elsevier Patient Education  2022 [employer]. .  Due to recent changes in healthcare laws, you may see the results of your imaging and  laboratory studies on MyChart before your provider has had a chance to review them.  We understand that in some cases there may be results that are confusing or concerning to you. Not all laboratory results come back in the same time frame and the provider may be waiting for multiple results in order to interpret others.  Please give [place] 48 hours in order for your provider to thoroughly review all the results before contacting the office for clarification of your results.  I appreciate the  opportunity to care for you  Thank You   Harl Bowie , MD

## 2021-06-06 ENCOUNTER — Ambulatory Visit: Payer: BC Managed Care – PPO | Admitting: Physician Assistant

## 2021-06-20 ENCOUNTER — Ambulatory Visit: Payer: BC Managed Care – PPO | Admitting: Gastroenterology

## 2021-07-25 ENCOUNTER — Ambulatory Visit: Payer: BC Managed Care – PPO | Admitting: Gastroenterology

## 2021-12-04 IMAGING — DX DG CHEST 1V PORT
1 series · 1 of 1 positions shown · non-contrast
Comparison: 11/02/2015

CLINICAL DATA: Shortness of breath.

EXAM:
PORTABLE CHEST 1 VIEW

[chest ap]
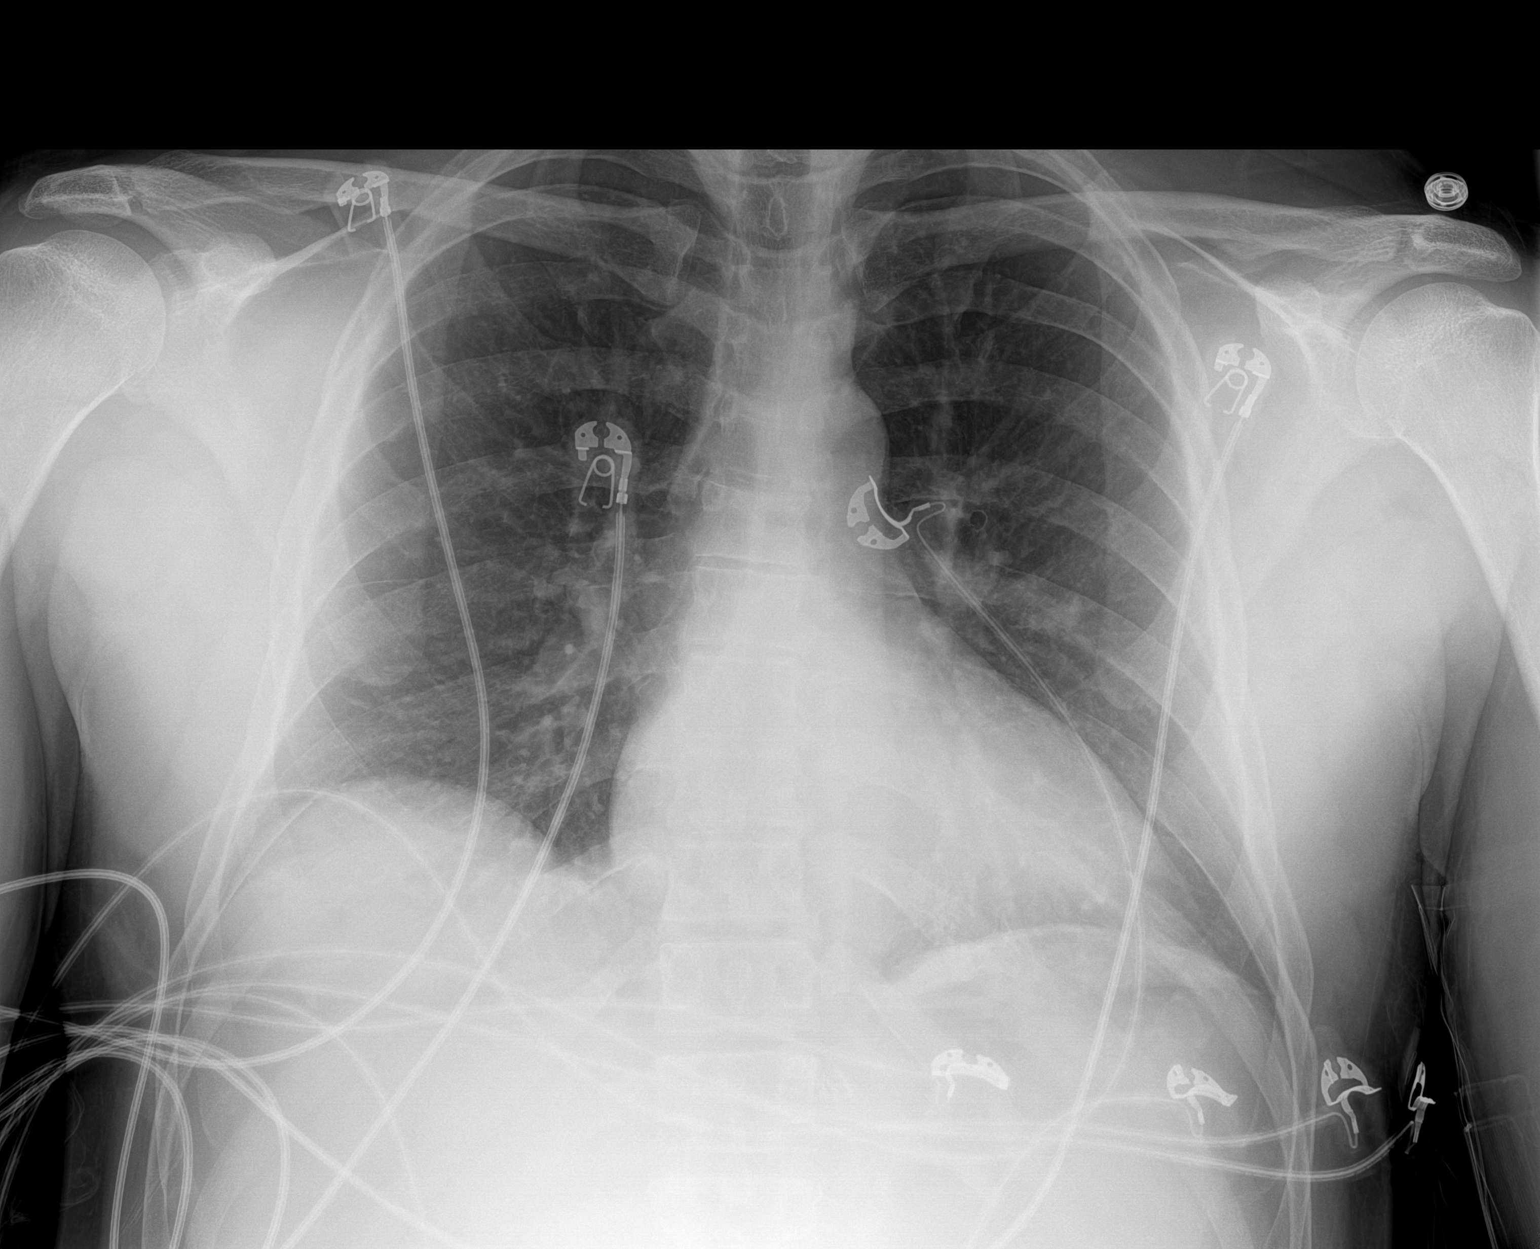

[1 of 1 positions shown; findings below may reference images not displayed]

FINDINGS: 6854 hours. The lungs are clear without focal pneumonia, edema,
pneumothorax or pleural effusion. Cardiopericardial silhouette is at
upper limits of normal for size. The visualized bony structures of
the thorax are intact. Telemetry leads overlie the chest.
IMPRESSION: No active disease.

## 2022-10-22 ENCOUNTER — Encounter: Payer: Self-pay | Admitting: Internal Medicine

## 2023-04-03 ENCOUNTER — Ambulatory Visit (INDEPENDENT_AMBULATORY_CARE_PROVIDER_SITE_OTHER): Payer: BC Managed Care – PPO

## 2023-04-03 ENCOUNTER — Encounter (HOSPITAL_COMMUNITY): Payer: Self-pay

## 2023-04-03 ENCOUNTER — Ambulatory Visit (HOSPITAL_COMMUNITY)
Admission: EM | Admit: 2023-04-03 | Discharge: 2023-04-03 | Disposition: A | Discharge status: Home / Self Care | Payer: BC Managed Care – PPO | Attending: Internal Medicine | Admitting: Internal Medicine

## 2023-04-03 DIAGNOSIS — M25449 Effusion, unspecified hand: Secondary | ICD-10-CM

## 2023-04-03 DIAGNOSIS — Y9367 Activity, basketball: Secondary | ICD-10-CM

## 2023-04-03 DIAGNOSIS — M25441 Effusion, right hand: Secondary | ICD-10-CM

## 2023-04-03 MED ORDER — PREDNISONE 20 MG PO TABS: 40.0000 mg | ORAL_TABLET | Freq: Every day | ORAL | 0 refills | Status: AC

## 2023-04-03 NOTE — ED Provider Notes (Signed)
[facility]    CSN: [id number] Arrival date & time: 04/03/23  1454      History   Chief Complaint Chief Complaint  Patient presents with   Finger Injury    HPI Phillip [name] is a 47 y.o. male.   Patient presents to urgent care for evaluation of pain and swelling to the PIP of the right ring finger that has been persistent for the last 3 weeks after he injured it while playing basketball.  Patient injured his right ring finger on March 15, 2023 while playing basketball.  He states there was a pediatrician playing with him on his team who believe the finger to be dislocated so he reduced it court side.  Patient did not get a postreduction x-ray to confirm placement or lack of fracture as a result of the reduction.  He has had continued soreness and swelling to the affected joint over the last 3 weeks.  No numbness or tingling distally to swelling or injury.  No bruising, redness, or warmth to the affected finger. He used ibuprofen/aleve when injury first happened for soreness but has not used this medication in a few weeks.  On chart review, he has a history of elevation of his creatinine on previous BMPs but I am only able to see BMP from 2021 (chart review).  He has never been told that he cannot take NSAIDs in the past.      Past Medical History:  Diagnosis Date   Internal hemorrhoids    Medical history non-contributory     Patient Active Problem List   Diagnosis Date Noted   Left anterior cruciate ligament tear 01/26/2018   Tear of medial meniscus of left knee, current 01/26/2018   Closed fracture of left tibial plateau 01/11/2018    Past Surgical History:  Procedure Laterality Date   ANTERIOR CRUCIATE LIGAMENT REPAIR Left 03/30/2018   Procedure: RECONSTRUCTION LEFT KNEE ANTERIOR CRUCIATE LIGAMENT (ACL) WITH HAMSTRING GRAFT AND MEDIAL MENISCAL RESECTION AND/OR REPAIR;  Surgeon: Cammy Copa, MD;  Location: [facility];  Service: Orthopedics;  Laterality: Left;    HEMORRHOID BANDING     wisdom toothe extraction         Home Medications    Prior to Admission medications   Medication Sig Start Date End Date Taking? Authorizing Provider  predniSONE (DELTASONE) 20 MG tablet Take 2 tablets (40 mg total) by mouth daily for 5 days. 04/03/23 04/08/23 Yes Aneri Slagel, Donavan Burnet, FNP  AMBULATORY NON FORMULARY MEDICATION Medication Name: Nitroglycerin ointment 0.125% Use pea sized amount per rectum x 2 week, continue as needed 05/20/21   Napoleon Form, MD  AMBULATORY NON FORMULARY MEDICATION Medication Name: Nitroglycerin ointment 0.125% use pea sized amount three times a day 05/28/21   Napoleon Form, MD  hydrocortisone (ANUSOL-HC) 2.5 % rectal cream Place 1 application rectally 2 (two) times daily. Use small amount per rectum twice a day for 7 days 05/28/21   Napoleon Form, MD    Family History Family History  Problem Relation Age of Onset   Liver disease Father    Alcoholism Father    Hypertension Maternal Grandmother    Colon cancer Neg Hx    Esophageal cancer Neg Hx    Rectal cancer Neg Hx    Stomach cancer Neg Hx     Social History Social History   Tobacco Use   Smoking status: Never   Smokeless tobacco: Never  Vaping Use   Vaping Use: Never used  Substance  Use Topics   Alcohol use: Yes    Comment: occ   Drug use: Never     Allergies   Patient has no known allergies.   Review of Systems Review of Systems Per HPI  Physical Exam Triage Vital Signs ED Triage Vitals [04/03/23 1603]  Enc Vitals Group     BP 124/89     Pulse Rate 88     Resp 14     Temp 98 F (36.7 C)     Temp Source Oral     SpO2 98 %     Weight      Height      Head Circumference      Peak Flow      Pain Score      Pain Loc      Pain Edu?      Excl. in GC?    No data found.  Updated Vital Signs BP 124/89 (BP Location: Right Arm)   Pulse 88   Temp 98 F (36.7 C) (Oral)   Resp 14   SpO2 98%   Visual Acuity Right Eye  Distance:   Left Eye Distance:   Bilateral Distance:    Right Eye Near:   Left Eye Near:    Bilateral Near:     Physical Exam Vitals and nursing note reviewed.  Constitutional:      Appearance: He is not ill-appearing or toxic-appearing.  HENT:     Head: Normocephalic and atraumatic.     Right Ear: Hearing and external ear normal.     Left Ear: Hearing and external ear normal.     Nose: Nose normal.     Mouth/Throat:     Lips: Pink.  Eyes:     General: Lids are normal. Vision grossly intact. Gaze aligned appropriately.     Extraocular Movements: Extraocular movements intact.     Conjunctiva/sclera: Conjunctivae normal.  Pulmonary:     Effort: Pulmonary effort is normal.  Musculoskeletal:     Right wrist: Normal.     Left wrist: Normal.     Right hand: Swelling (Diffuse swelling of PIP joint of right ring finger) and tenderness (PIP joint of right ring finger) present. No deformity or lacerations. Normal range of motion. Normal strength (Strength and sensation intact distally to injury/swelling.). Normal sensation. There is no disruption of two-point discrimination. Normal capillary refill. Normal pulse (Strong +2 right radial pulse).     Left hand: Normal.     Cervical back: Neck supple.  Skin:    General: Skin is warm and dry.     Capillary Refill: Capillary refill takes less than 2 seconds.     Findings: No rash.  Neurological:     General: No focal deficit present.     Mental Status: He is alert and oriented to person, place, and time. Mental status is at baseline.     Cranial Nerves: No dysarthria or facial asymmetry.  Psychiatric:        Mood and Affect: Mood normal.        Speech: Speech normal.        Behavior: Behavior normal.        Thought Content: Thought content normal.        Judgment: Judgment normal.      UC Treatments / Results  Labs (all labs ordered are listed, but only abnormal results are displayed) Labs Reviewed - No data to  display  EKG   Radiology No results found.  Procedures Procedures (  including critical care time)  Medications Ordered in UC Medications - No data to display  Initial Impression / Assessment and Plan / UC Course  I have reviewed the triage vital signs and the nursing notes.  Pertinent labs & imaging results that were available during my care of the patient were reviewed by me and considered in my medical decision making (see chart for details).   1.  Injury while playing basketball, soft tissue swelling of joint of finger I personally reviewed x-ray images of the right ring finger and I do not see any acute bony abnormalities.  Radiology report is delayed in getting back.  Patient placed in static finger splint and advised to follow-up with Dr. Merlyn Lot on-call hand surgeon on Monday or sometime next week for further evaluation of symptoms.  May use prednisone 40 mg once a day for the next 5 days with breakfast to reduce inflammation and pain as he has a history of elevated creatinine and should avoid taking NSAIDs per recent BMPs.  No NSAIDs while taking prednisone.  RICE advised.  He is agreeable with plan.  Will call patient if there are any acute bony abnormalities on x-ray via radiologist report.  He is neurovascularly intact distally to injury.   Discussed physical exam and available lab work findings in clinic with patient.  Counseled patient regarding appropriate use of medications and potential side effects for all medications recommended or prescribed today. Discussed red flag signs and symptoms of worsening condition,when to call the PCP office, return to urgent care, and when to seek higher level of care in the emergency department. Patient verbalizes understanding and agreement with plan. All questions answered. Patient discharged in stable condition.   Final Clinical Impressions(s) / UC Diagnoses   Final diagnoses:  Injury while playing basketball  Soft tissue swelling of joint  of finger     Discharge Instructions      Your x-rays appear normal, however I will call you if the radiologist reads the x-ray as abnormal.  Wear the finger splint until your orthopedic follow-up appointment for stability and compression.  Take prednisone 40 mg once daily in the mornings with breakfast.  Take this with food to avoid stomach upset.  Do not take any ibuprofen/naproxen/Aleve when taking this.  Do not start taking this until tomorrow morning because it can cause you to want to stay up all night long.  Call Dr. Merrilee Seashore office Monday morning to schedule an appointment for follow-up.  He is a Public house manager in [place].   If you develop any new or worsening symptoms or do not improve in the next 2 to 3 days, please return.  If your symptoms are severe, please go to the emergency room.  Follow-up with your primary care provider for further evaluation and management of your symptoms as well as ongoing wellness visits.  I hope you feel better!     ED Prescriptions     Medication Sig Dispense Auth. Provider   predniSONE (DELTASONE) 20 MG tablet Take 2 tablets (40 mg total) by mouth daily for 5 days. 10 tablet Carlisle Beers, FNP      PDMP not reviewed this encounter.   Carlisle Beers, [place] 04/03/23 1719

## 2023-04-03 NOTE — ED Triage Notes (Signed)
Patient reports that he dislocated the right ring finger on 03/15/23 while playing baseball and states a physician on the team set it for him.  Patient states he has had swelling and pain to the right finger middle joint since 03/14/22.

## 2023-04-03 NOTE — Discharge Instructions (Addendum)
Your x-rays appear normal, however I will call you if the radiologist reads the x-ray as abnormal.  Wear the finger splint until your orthopedic follow-up appointment for stability and compression.  Take prednisone 40 mg once daily in the mornings with breakfast.  Take this with food to avoid stomach upset.  Do not take any ibuprofen/naproxen/Aleve when taking this.  Do not start taking this until tomorrow morning because it can cause you to want to stay up all night long.  Call Dr. Merrilee Seashore office Monday morning to schedule an appointment for follow-up.  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   If you develop any new or worsening symptoms or do not improve in the next 2 to 3 days, please return.  If your symptoms are severe, please go to the emergency room.  Follow-up with your primary care provider for further evaluation and management of your symptoms as well as ongoing wellness visits.  I hope you feel better!

## 2024-05-13 ENCOUNTER — Encounter: Payer: Self-pay | Admitting: Physician Assistant

## 2024-05-13 ENCOUNTER — Ambulatory Visit: Payer: Self-pay | Admitting: Physician Assistant

## 2024-05-13 VITALS — BP 116/72 | HR 65 | Ht 67.0 in | Wt 173.0 lb

## 2024-05-13 DIAGNOSIS — K644 Residual hemorrhoidal skin tags: Secondary | ICD-10-CM | POA: Diagnosis not present

## 2024-05-13 DIAGNOSIS — K6289 Other specified diseases of anus and rectum: Secondary | ICD-10-CM

## 2024-05-13 DIAGNOSIS — K602 Anal fissure, unspecified: Secondary | ICD-10-CM

## 2024-05-13 MED ORDER — AMBULATORY NON FORMULARY MEDICATION: 1 refills | Status: AC

## 2024-05-13 NOTE — Patient Instructions (Addendum)
 _______________________________________________________  If your blood pressure at your visit was 140/90 or greater, please contact your primary care physician to follow up on this.  _______________________________________________________  If you are age 50 or older, your body mass index should be between 23-30. Your Body mass index is 27.1 kg/m. If this is out of the aforementioned range listed, please consider follow up with your Primary Care Provider.  If you are age 70 or younger, your body mass index should be between 19-25. Your Body mass index is 27.1 kg/m. If this is out of the aformentioned range listed, please consider follow up with your Primary Care Provider.   ________________________________________________________  The [facility] providers would like to encourage you to use MYCHART to communicate with providers for non-urgent requests or questions.  Due to long hold times on the telephone, sending your provider a message by [facility] may be a faster and more efficient way to get a response.  Please allow 48 business hours for a response.  Please remember that this is for non-urgent requests.  _______________________________________________________  Medication sent to [facility] information is below: Address: [address], [place], [place] [address]  [phone]  *Please DO NOT go directly from our office to pick up this medication! Give the pharmacy 1 day to process the prescription as this is compounded and takes time to make.  Script has been given to you as well to take  Please call [facility] in 2 weeks regarding your referral at [phone]  It was a pleasure to see you today!  Thank you for trusting me with your gastrointestinal care!

## 2024-05-13 NOTE — Progress Notes (Signed)
 Chief Complaint: Hemorrhoids  HPI:    [name] is a 48 year old male with a past medical history as listed below including internal hemorrhoids, known to Dr. Leonia [name], who was referred to me by Phillip Abraham, NP-C for a complaint of hemorrhoid.    02/21/2020 colonoscopy with a less than 1 year polyp in the transverse colon, scattered small mouth diverticula sigmoid and descending colon, nonbleeding internal hemorrhoids.  Pathology showed benign colonic mucosa with lymphoid aggregate.  Repeat recommended 10 years.    05/28/2021 office visit with Dr. Nandigam for thrombosed hemorrhoids.  At that time had been having symptoms for 2 to 3 months.  He is status post hemorrhoidal band ligation x 2 in 2021.  At that time diagnosed with anal fissure and thrombosed external hemorrhoid and sent an urgent referral to [facility] to lance a thrombosed external hemorrhoid.  Told to use Anusol  cream twice daily with sitz bath.  Prescribe Nitroglycerin 0.125% 3 3 times daily x 6 to 8 weeks.    11/18/2021 patient seen by general surgery for anal itching at that time discussed a 2-year history of anorectal problems including treatment of an anal fissure, internal hemorrhoids and external hemorrhoids.  At that point given Triamcinolone 0.5% ointment to use topically at bedtime for 2 weeks and then stop.    Today, patient presents to clinic and explains that he actually "did ok" for a couple of years where things weren't really bothering.  Then over the past couple of months he has had increasing rectal discomfort and itching.  Also some pain and feels a few new "hard lumps" on the outside.  He has been using OTC hemorrhoid cream and now Hydroctorisone over the past 2-3 weeks typically once daily. Denis straining.    Denies fever, chill, weight loss or change in bowel habits.  Past Medical History:  Diagnosis Date   Internal hemorrhoids    Medical history non-contributory     Past Surgical History:  Procedure Laterality  Date   ANTERIOR CRUCIATE LIGAMENT REPAIR Left 03/30/2018   Procedure: RECONSTRUCTION LEFT KNEE ANTERIOR CRUCIATE LIGAMENT (ACL) WITH HAMSTRING GRAFT AND MEDIAL MENISCAL RESECTION AND/OR REPAIR;  Surgeon: Phillip Mesi, MD;  Location: [facility];  Service: Orthopedics;  Laterality: Left;   HEMORRHOID BANDING     wisdom toothe extraction      Current Outpatient Medications  Medication Sig Dispense Refill   AMBULATORY NON FORMULARY MEDICATION Medication Name: Nitroglycerin ointment 0.125% Use pea sized amount per rectum x 2 week, continue as needed 30 g 1   AMBULATORY NON FORMULARY MEDICATION Medication Name: Nitroglycerin ointment 0.125% use pea sized amount three times a day 30 g 1   hydrocortisone  (ANUSOL -HC) 2.5 % rectal cream Place 1 application rectally 2 (two) times daily. Use small amount per rectum twice a day for 7 days 30 g 1   No current facility-administered medications for this visit.    Allergies as of 05/13/2024   (No Known Allergies)    Family History  Problem Relation Age of Onset   Liver disease Father    Alcoholism Father    Hypertension Maternal Grandmother    Colon cancer Neg Hx    Esophageal cancer Neg Hx    Rectal cancer Neg Hx    Stomach cancer Neg Hx     Social History   Socioeconomic History   Marital status: Married    Spouse name: Not on file   Number of children: 4   Years of education: Not on file  Highest education level: Not on file  Occupational History   Occupation: [employer]  Tobacco Use   Smoking status: Never   Smokeless tobacco: Never  Vaping Use   Vaping status: Never Used  Substance and Sexual Activity   Alcohol use: Yes    Comment: occ   Drug use: Never   Sexual activity: Yes  Other Topics Concern   Not on file  Social History Narrative   Not on file   Social Drivers of Health   Financial Resource Strain: Not on file  Food Insecurity: Not on file  Transportation Needs: Not on file  Physical Activity: Not on file  Stress:  Not on file  Social Connections: Not on file  Intimate Partner Violence: Not on file    Review of Systems:    Constitutional: No weight loss, fever or chills Cardiovascular: No chest pain Respiratory: No SOB Gastrointestinal: See HPI and otherwise negative   Physical Exam:  Vital signs: BP 116/72   Pulse 65   Ht 5\' 7"  (1.702 m)   Wt 173 lb (78.5 kg)   BMI 27.10 kg/m    Constitutional:   Pleasant AA male appears to be in NAD, Well developed, Well nourished, alert and cooperativeauscultation bilaterally.   No wheezes, crackles, or rhonchi.  Cardiovascular: Normal S1, S2. No MRG. Regular rate and rhythm. No peripheral edema, cyanosis or pallor.  Gastrointestinal:  Soft, nondistended, nontender. No rebound or guarding. Normal bowel sounds. No appreciable masses or hepatomegaly. Rectal:  External: one large hemorrhoid tag, two small esternal hemorrhoid tags; Internal: ttp posteriorly, no mass; Anoscopy: unable to insert fully due to increased sphincter tone Psychiatric: Demonstrates good judgement and reason without abnormal affect or behaviors.  RELEVANT LABS AND IMAGING: CBC    Component Value Date/Time   WBC 8.0 02/23/2020 0835   RBC 4.69 02/23/2020 0835   HGB 13.8 02/23/2020 0835   HGB 14.9 01/04/2020 1128   HCT 42.3 02/23/2020 0835   HCT 43.7 01/04/2020 1128   PLT 316 02/23/2020 0835   PLT 305 01/04/2020 1128   MCV 90.2 02/23/2020 0835   MCV 88 01/04/2020 1128   MCH 29.4 02/23/2020 0835   MCHC 32.6 02/23/2020 0835   RDW 12.6 02/23/2020 0835   RDW 12.4 01/04/2020 1128   LYMPHSABS 1.3 01/04/2020 1128   MONOABS 0.2 04/08/2009 0322   EOSABS 0.0 01/04/2020 1128   BASOSABS 0.0 01/04/2020 1128    CMP     Component Value Date/Time   NA 140 02/23/2020 0835   NA 137 01/04/2020 1128   K 4.1 02/23/2020 0835   CL 103 02/23/2020 0835   CO2 25 02/23/2020 0835   GLUCOSE 220 (H) 02/23/2020 0835   BUN 13 02/23/2020 0835   BUN 11 01/04/2020 1128   CREATININE 1.48 (H)  02/23/2020 0835   CALCIUM 9.5 02/23/2020 0835   PROT 7.7 01/04/2020 1128   ALBUMIN 4.6 01/04/2020 1128   AST 15 01/04/2020 1128   ALT 13 01/04/2020 1128   ALKPHOS 76 01/04/2020 1128   BILITOT 0.3 01/04/2020 1128   GFRNONAA 56 ([name]) 02/23/2020 0835   GFRAA >60 02/23/2020 0835    Assessment: 1. External hemorrhoid tags: seen at time of exam today,  these are causing the patient itching and discomfort 2. Presumed posterior fissure: unable to visualize but very tender to palpation and history of the same  Plan: 1. Re-referred to [facility] for discussion of external hemorrhoid excision and removal of tags which are causing him significant discomfort 2. Prescribed Diltiazem  5% with lidocaine  TID to fissure for 6-8 weeks. 3.  Discussed limiting Hydrocortisone  to help healing 4.  Sitz baths 5.  Recti-care with Lidocaine  prn 6.  Patient to follow in clinic with us  as needed.  Phillip Capra, PA-C [facility] 05/13/2024, 10:34 AM  Cc: Phillip [name], Phillip L, NP-C

## 2024-05-31 ENCOUNTER — Ambulatory Visit: Payer: Self-pay | Admitting: General Surgery

## 2024-05-31 NOTE — H&P (Addendum)
 PROVIDER:  BERNARDA WANDA NED, MD  MRN: [id number] DOB: [birth year] DATE OF ENCOUNTER: 05/31/2024 Subjective   Chief Complaint: Follow-up (hemorrhoids)     History of Present Illness: Phillip [name] is a 48 y.o. male who is seen today for anal irritation.  He complains of chronically inflamed skin tags.  He has pain with BM's and sitting as well as itching and drainage throughout the day.  He denies any constipation, diarrhea or straining.    Past Medical History:  Diagnosis Date   Internal hemorrhoids    Medical history non-contributory     Past Surgical History:  Procedure Laterality Date   ANTERIOR CRUCIATE LIGAMENT REPAIR Left 03/30/2018   Procedure: RECONSTRUCTION LEFT KNEE ANTERIOR CRUCIATE LIGAMENT (ACL) WITH HAMSTRING GRAFT AND MEDIAL MENISCAL RESECTION AND/OR REPAIR;  Surgeon: Addie Cordella Hamilton, MD;  Location: [facility];  Service: Orthopedics;  Laterality: Left;   HEMORRHOID BANDING     wisdom toothe extraction      Family History  Problem Relation Age of Onset   Liver disease Father    Alcoholism Father    Hypertension Maternal Grandmother    Colon cancer Neg Hx    Esophageal cancer Neg Hx    Rectal cancer Neg Hx    Stomach cancer Neg Hx     Social History   Socioeconomic History   Marital status: Married    Spouse name: Not on file   Number of children: 4   Years of education: Not on file   Highest education level: Not on file  Occupational History   Occupation: [employer]  Tobacco Use   Smoking status: Never   Smokeless tobacco: Never  Vaping Use   Vaping status: Never Used  Substance and Sexual Activity   Alcohol use: Yes    Comment: occ   Drug use: Never   Sexual activity: Yes  Other Topics Concern   Not on file  Social History Narrative   Not on file   Social Drivers of Health   Financial Resource Strain: Not on file  Food Insecurity: Not on file  Transportation Needs: Not on file  Physical Activity: Not on file  Stress: Not on file   Social Connections: Not on file  Intimate Partner Violence: Not on file     Current Outpatient Medications:    AMBULATORY NON FORMULARY MEDICATION, Medication Name: Medication Name: Diltiazem 2% gel/Lidocaine  5% Apply a small amount to the inside and external rectum area 3 times a day for 6-8 weeks., Disp: 30 g, Rfl: 1  No Known Allergies  Review of Systems - Negative except as stated above     Objective:    Vitals:   05/31/24 1017  PainSc: 0-No pain      Gen: NAD Rectal: L posterior inflamed nodule, Large RP skin tag  Labs, Imaging and Diagnostic Testing:   Procedure: Anoscopy Surgeon: NED After the risks and benefits were explained, written consent was obtained for above procedure.  A medical assistant chaperone was present thoroughout the entire procedure.  Anesthesia: none Diagnosis: anal irritation Findings: no internal hemorrhoids noted  Assessment and Plan:  Diagnoses and all orders for this visit:  Anal irritation    Pt with chronic skin irritation and concerning L posterior nodule.  I have recommended EUA and excision of this.  We will leave the RP skin tag for now.    BERNARDA JAYSON NED, MD Colon and Rectal Surgery [facility]     BERNARDA JAYSON NED, MD Colon and Rectal  Surgery [facility]

## 2024-05-31 NOTE — H&P (View-Only) (Signed)
 PROVIDER:  BERNARDA WANDA NED, MD  MRN: [id number] DOB: [birth year] DATE OF ENCOUNTER: 05/31/2024 Subjective   Chief Complaint: Follow-up (hemorrhoids)     History of Present Illness: Phillip [name] is a 48 y.o. male who is seen today for anal irritation.  He complains of chronically inflamed skin tags.  He has pain with BM's and sitting as well as itching and drainage throughout the day.  He denies any constipation, diarrhea or straining.    Past Medical History:  Diagnosis Date   Internal hemorrhoids    Medical history non-contributory     Past Surgical History:  Procedure Laterality Date   ANTERIOR CRUCIATE LIGAMENT REPAIR Left 03/30/2018   Procedure: RECONSTRUCTION LEFT KNEE ANTERIOR CRUCIATE LIGAMENT (ACL) WITH HAMSTRING GRAFT AND MEDIAL MENISCAL RESECTION AND/OR REPAIR;  Surgeon: Addie Cordella Hamilton, MD;  Location: [facility] OR;  Service: Orthopedics;  Laterality: Left;   HEMORRHOID BANDING     wisdom toothe extraction      Family History  Problem Relation Age of Onset   Liver disease Father    Alcoholism Father    Hypertension Maternal Grandmother    Colon cancer Neg Hx    Esophageal cancer Neg Hx    Rectal cancer Neg Hx    Stomach cancer Neg Hx     Social History   Socioeconomic History   Marital status: Married    Spouse name: Not on file   Number of children: 4   Years of education: Not on file   Highest education level: Not on file  Occupational History   Occupation: [employer]  Tobacco Use   Smoking status: Never   Smokeless tobacco: Never  Vaping Use   Vaping status: Never Used  Substance and Sexual Activity   Alcohol use: Yes    Comment: occ   Drug use: Never   Sexual activity: Yes  Other Topics Concern   Not on file  Social History Narrative   Not on file   Social Drivers of Health   Financial Resource Strain: Not on file  Food Insecurity: Not on file  Transportation Needs: Not on file  Physical Activity: Not on file  Stress: Not on file   Social Connections: Not on file  Intimate Partner Violence: Not on file     Current Outpatient Medications:    AMBULATORY NON FORMULARY MEDICATION, Medication Name: Medication Name: Diltiazem 2% gel/Lidocaine  5% Apply a small amount to the inside and external rectum area 3 times a day for 6-8 weeks., Disp: 30 g, Rfl: 1  No Known Allergies  Review of Systems - Negative except as stated above     Objective:    Vitals:   05/31/24 1017  PainSc: 0-No pain      Gen: NAD Rectal: L posterior inflamed nodule, Large RP skin tag  Labs, Imaging and Diagnostic Testing:   Procedure: Anoscopy Surgeon: NED After the risks and benefits were explained, written consent was obtained for above procedure.  A medical assistant chaperone was present thoroughout the entire procedure.  Anesthesia: none Diagnosis: anal irritation Findings: no internal hemorrhoids noted  Assessment and Plan:  Diagnoses and all orders for this visit:  Anal irritation    Pt with chronic skin irritation and concerning L posterior nodule.  I have recommended EUA and excision of this.  We will leave the RP skin tag for now.    BERNARDA JAYSON NED, MD Colon and Rectal Surgery [facility]     BERNARDA JAYSON NED, MD Colon and Rectal  Surgery [facility]

## 2024-06-17 ENCOUNTER — Encounter (HOSPITAL_BASED_OUTPATIENT_CLINIC_OR_DEPARTMENT_OTHER): Payer: Self-pay | Admitting: General Surgery

## 2024-06-24 ENCOUNTER — Ambulatory Visit (HOSPITAL_BASED_OUTPATIENT_CLINIC_OR_DEPARTMENT_OTHER): Admitting: Certified Registered Nurse Anesthetist

## 2024-06-24 ENCOUNTER — Encounter (HOSPITAL_BASED_OUTPATIENT_CLINIC_OR_DEPARTMENT_OTHER)
Admission: RE | Disposition: A | Discharge status: Home / Self Care | Payer: Self-pay | Source: Home / Self Care | Attending: General Surgery

## 2024-06-24 ENCOUNTER — Ambulatory Visit (HOSPITAL_BASED_OUTPATIENT_CLINIC_OR_DEPARTMENT_OTHER)
Admission: RE | Admit: 2024-06-24 | Discharge: 2024-06-24 | Disposition: A | Discharge status: Home / Self Care | Attending: General Surgery | Admitting: General Surgery

## 2024-06-24 DIAGNOSIS — K602 Anal fissure, unspecified: Secondary | ICD-10-CM

## 2024-06-24 DIAGNOSIS — L918 Other hypertrophic disorders of the skin: Secondary | ICD-10-CM | POA: Insufficient documentation

## 2024-06-24 DIAGNOSIS — K6289 Other specified diseases of anus and rectum: Secondary | ICD-10-CM | POA: Insufficient documentation

## 2024-06-24 DIAGNOSIS — Z01818 Encounter for other preprocedural examination: Secondary | ICD-10-CM

## 2024-06-24 DIAGNOSIS — K644 Residual hemorrhoidal skin tags: Secondary | ICD-10-CM

## 2024-06-24 HISTORY — PX: RECTAL EXAM UNDER ANESTHESIA: SHX6399

## 2024-06-24 HISTORY — PX: EXCISION OF SKIN TAG: SHX6270

## 2024-06-24 SURGERY — EXCISION, SKIN TAG
Anesthesia: Monitor Anesthesia Care | Site: Rectum

## 2024-06-24 MED ORDER — DEXAMETHASONE SODIUM PHOSPHATE 10 MG/ML IJ SOLN
INTRAMUSCULAR | Status: AC
  Filled 2024-06-24: qty 1

## 2024-06-24 MED ORDER — DEXMEDETOMIDINE HCL IN NACL 80 MCG/20ML IV SOLN
INTRAVENOUS | Status: DC | PRN
  Administered 2024-06-24: 8 ug via INTRAVENOUS

## 2024-06-24 MED ORDER — KETOROLAC TROMETHAMINE 30 MG/ML IJ SOLN
INTRAMUSCULAR | Status: DC | PRN
  Administered 2024-06-24: 30 mg via INTRAVENOUS

## 2024-06-24 MED ORDER — SODIUM CHLORIDE 0.9% FLUSH: 3.0000 mL | Freq: Two times a day (BID) | INTRAVENOUS | Status: DC

## 2024-06-24 MED ORDER — KETAMINE HCL 10 MG/ML IJ SOLN
INTRAMUSCULAR | Status: DC | PRN
  Administered 2024-06-24: 25 mg via INTRAVENOUS

## 2024-06-24 MED ORDER — OXYCODONE HCL 5 MG/5ML PO SOLN: 5.0000 mg | Freq: Once | ORAL | Status: DC | PRN

## 2024-06-24 MED ORDER — ACETAMINOPHEN 500 MG PO TABS
1000.0000 mg | ORAL_TABLET | ORAL | Status: AC
  Administered 2024-06-24: 1000 mg via ORAL

## 2024-06-24 MED ORDER — DEXAMETHASONE SODIUM PHOSPHATE 4 MG/ML IJ SOLN
INTRAMUSCULAR | Status: DC | PRN
  Administered 2024-06-24: 5 mg via INTRAVENOUS

## 2024-06-24 MED ORDER — KETAMINE HCL 50 MG/5ML IJ SOSY
PREFILLED_SYRINGE | INTRAMUSCULAR | Status: AC
  Filled 2024-06-24: qty 5

## 2024-06-24 MED ORDER — MIDAZOLAM HCL 2 MG/2ML IJ SOLN
INTRAMUSCULAR | Status: AC
  Filled 2024-06-24: qty 2

## 2024-06-24 MED ORDER — ONDANSETRON HCL 4 MG/2ML IJ SOLN: 4.0000 mg | Freq: Four times a day (QID) | INTRAMUSCULAR | Status: DC | PRN

## 2024-06-24 MED ORDER — ONDANSETRON HCL 4 MG/2ML IJ SOLN
INTRAMUSCULAR | Status: AC
  Filled 2024-06-24: qty 2

## 2024-06-24 MED ORDER — OXYCODONE HCL 5 MG PO TABS: 5.0000 mg | ORAL_TABLET | Freq: Once | ORAL | Status: DC | PRN

## 2024-06-24 MED ORDER — LIDOCAINE HCL (CARDIAC) PF 100 MG/5ML IV SOSY
PREFILLED_SYRINGE | INTRAVENOUS | Status: DC | PRN
Start: 2024-06-24 — End: 2024-06-24
  Administered 2024-06-24: 50 mg via INTRAVENOUS

## 2024-06-24 MED ORDER — 0.9 % SODIUM CHLORIDE (POUR BTL) OPTIME
TOPICAL | Status: DC | PRN
  Administered 2024-06-24: 1000 mL

## 2024-06-24 MED ORDER — FENTANYL CITRATE (PF) 100 MCG/2ML IJ SOLN
INTRAMUSCULAR | Status: AC
Start: 2024-06-24 — End: 2024-06-24
  Filled 2024-06-24: qty 2

## 2024-06-24 MED ORDER — ONDANSETRON HCL 4 MG/2ML IJ SOLN
INTRAMUSCULAR | Status: DC | PRN
  Administered 2024-06-24: 4 mg via INTRAVENOUS

## 2024-06-24 MED ORDER — LIDOCAINE 2% (20 MG/ML) 5 ML SYRINGE
INTRAMUSCULAR | Status: AC
  Filled 2024-06-24: qty 5

## 2024-06-24 MED ORDER — PROPOFOL 500 MG/50ML IV EMUL
INTRAVENOUS | Status: DC | PRN
Start: 2024-06-24 — End: 2024-06-24
  Administered 2024-06-24: 150 ug/kg/min via INTRAVENOUS

## 2024-06-24 MED ORDER — KETOROLAC TROMETHAMINE 30 MG/ML IJ SOLN
INTRAMUSCULAR | Status: AC
  Filled 2024-06-24: qty 1

## 2024-06-24 MED ORDER — TRAMADOL HCL 50 MG PO TABS: 50.0000 mg | ORAL_TABLET | Freq: Four times a day (QID) | ORAL | 0 refills | Status: AC | PRN

## 2024-06-24 MED ORDER — LACTATED RINGERS IV SOLN: INTRAVENOUS | Status: DC

## 2024-06-24 MED ORDER — FENTANYL CITRATE (PF) 100 MCG/2ML IJ SOLN: 25.0000 ug | INTRAMUSCULAR | Status: DC | PRN

## 2024-06-24 MED ORDER — BUPIVACAINE-EPINEPHRINE 0.5% -1:200000 IJ SOLN
INTRAMUSCULAR | Status: DC | PRN
  Administered 2024-06-24: 29 mL

## 2024-06-24 MED ORDER — DEXMEDETOMIDINE HCL IN NACL 80 MCG/20ML IV SOLN
INTRAVENOUS | Status: AC
  Filled 2024-06-24: qty 20

## 2024-06-24 MED ORDER — MIDAZOLAM HCL 5 MG/5ML IJ SOLN
INTRAMUSCULAR | Status: DC | PRN
  Administered 2024-06-24: 2 mg via INTRAVENOUS

## 2024-06-24 MED ORDER — ACETAMINOPHEN 500 MG PO TABS
ORAL_TABLET | ORAL | Status: AC
  Filled 2024-06-24: qty 2

## 2024-06-24 SURGICAL SUPPLY — 49 items
BENZOIN TINCTURE PRP APPL 2/3 (GAUZE/BANDAGES/DRESSINGS) ×4 IMPLANT
BLADE EXTENDED COATED 6.5IN (ELECTRODE) IMPLANT
BLADE SURG 10 STRL SS (BLADE) ×2 IMPLANT
BRIEF MESH DISP 2XL (UNDERPADS AND DIAPERS) ×2 IMPLANT
COVER BACK TABLE 60X90IN (DRAPES) ×2 IMPLANT
COVER MAYO STAND STRL (DRAPES) ×2 IMPLANT
DRAPE HYSTEROSCOPY (MISCELLANEOUS) IMPLANT
DRAPE LAPAROTOMY 100X72 PEDS (DRAPES) ×2 IMPLANT
DRAPE UTILITY XL STRL (DRAPES) ×2 IMPLANT
ELECTRODE REM PT RTRN 9FT ADLT (ELECTROSURGICAL) ×2 IMPLANT
GAUZE 4X4 16PLY ~~LOC~~+RFID DBL (SPONGE) ×2 IMPLANT
GAUZE PAD ABD 8X10 STRL (GAUZE/BANDAGES/DRESSINGS) ×2 IMPLANT
GAUZE SPONGE 4X4 12PLY STRL (GAUZE/BANDAGES/DRESSINGS) IMPLANT
GLOVE BIO SURGEON STRL SZ 6.5 (GLOVE) ×2 IMPLANT
GLOVE INDICATOR 6.5 STRL GRN (GLOVE) ×2 IMPLANT
GOWN STRL REUS W/TWL XL LVL3 (GOWN DISPOSABLE) ×2 IMPLANT
HYDROGEN PEROXIDE 16OZ (MISCELLANEOUS) IMPLANT
IV CATH 14GX2 1/4 (CATHETERS) IMPLANT
IV CATH 18G SAFETY (IV SOLUTION) IMPLANT
KIT SIGMOIDOSCOPE (SET/KITS/TRAYS/PACK) IMPLANT
KIT TURNOVER KIT B (KITS) ×2 IMPLANT
LEGGING LITHOTOMY PAIR STRL (DRAPES) IMPLANT
LOOP VASCLR MAXI BLUE 18IN ST (MISCELLANEOUS) IMPLANT
LOOPS VASCLR MAXI BLUE 18IN ST (MISCELLANEOUS) IMPLANT
NDL HYPO 22X1.5 SAFETY MO (MISCELLANEOUS) ×2 IMPLANT
NDL SAFETY ECLIPSE 18X1.5 (NEEDLE) IMPLANT
NEEDLE HYPO 22X1.5 SAFETY MO (MISCELLANEOUS) ×1 IMPLANT
NS IRRIG 1000ML POUR BTL (IV SOLUTION) ×2 IMPLANT
PACK BASIN DAY SURGERY FS (CUSTOM PROCEDURE TRAY) ×2 IMPLANT
PAD ARMBOARD POSITIONER FOAM (MISCELLANEOUS) IMPLANT
PENCIL SMOKE EVACUATOR (MISCELLANEOUS) ×2 IMPLANT
SHEET MEDIUM DRAPE 40X70 STRL (DRAPES) IMPLANT
SLEEVE SCD COMPRESS KNEE MED (STOCKING) ×2 IMPLANT
SPIKE FLUID TRANSFER (MISCELLANEOUS) ×2 IMPLANT
SPONGE HEMORRHOID 8X3CM (HEMOSTASIS) IMPLANT
SPONGE SURGIFOAM ABS GEL 12-7 (HEMOSTASIS) IMPLANT
SUCTION TUBE FRAZIER 10FR DISP (SUCTIONS) IMPLANT
SUT CHROMIC 2 0 SH (SUTURE) IMPLANT
SUT CHROMIC 3 0 SH 27 (SUTURE) IMPLANT
SUT ETHIBOND 0 (SUTURE) IMPLANT
SUT VIC AB 2-0 SH 27XBRD (SUTURE) IMPLANT
SUT VIC AB 3-0 SH 18 (SUTURE) IMPLANT
SUT VIC AB 3-0 SH 27X BRD (SUTURE) IMPLANT
SUT VIC AB 3-0 SH 27XBRD (SUTURE) IMPLANT
SYR CONTROL 10ML LL (SYRINGE) ×2 IMPLANT
TOWEL GREEN STERILE FF (TOWEL DISPOSABLE) ×2 IMPLANT
TRAY DSU PREP LF (CUSTOM PROCEDURE TRAY) ×2 IMPLANT
TUBE CONNECTING 20X1/4 (TUBING) ×2 IMPLANT
YANKAUER SUCT BULB TIP NO VENT (SUCTIONS) ×2 IMPLANT

## 2024-06-24 NOTE — Discharge Instructions (Addendum)
 ANORECTAL SURGERY: POST OP INSTRUCTIONS Take your usually prescribed home medications unless otherwise directed. DIET: During the first few hours after surgery sip on some liquids until you are able to urinate.  It is normal to not urinate for several hours after this surgery.  If you feel uncomfortable, please contact the office for instructions.  After you are able to urinate,you may eat, if you feel like it.  Follow a light bland diet the first 24 hours after arrival home, such as soup, liquids, crackers, etc.  Be sure to include lots of fluids daily (6-8 glasses).  Avoid fast food or heavy meals, as your are more likely to get nauseated.  Eat a low fat diet the next few days after surgery.  Limit caffeine intake to 1-2 servings a day. PAIN CONTROL: Pain is best controlled by a usual combination of several different methods TOGETHER: Muscle relaxation: Soak in a warm bath (or Sitz bath) three times a day and after bowel movements.  Continue to do this until all pain is resolved. Over the counter pain medication Prescription pain medication Most patients will experience some swelling and discomfort in the anus/rectal area and incisions.  Heat such as warm towels, sitz baths, warm baths, etc to help relax tight/sore spots and speed recovery.  Some people prefer to use ice, especially in the first couple days after surgery, as it may decrease the pain and swelling, or alternate between ice & heat.  Experiment to what works for you.  Swelling and bruising can take several weeks to resolve.  Pain can take even longer to completely resolve. It is helpful to take an over-the-counter pain medication regularly for the first few weeks.  Choose one of the following that works best for you: Naproxen (Aleve, etc)  Two 220mg  tabs twice a day Ibuprofen (Advil, etc) Three 200mg  tabs four times a day (every meal & bedtime) A  prescription for pain medication (such as percocet, oxycodone, hydrocodone, etc) should be  given to you upon discharge.  Take your pain medication as prescribed.  If you are having problems/concerns with the prescription medicine (does not control pain, nausea, vomiting, rash, itching, etc), please call us  [phone] to see if we need to switch you to a different pain medicine that will work better for you and/or control your side effect better. If you need a refill on your pain medication, please contact your pharmacy.  They will contact our office to request authorization. Prescriptions will not be filled after 5 pm or on week-ends. KEEP YOUR BOWELS REGULAR and AVOID CONSTIPATION The goal is one to two soft bowel movements a day.  You should at least have a bowel movement every other day. Avoid getting constipated.  Between the surgery and the pain medications, it is common to experience some constipation. This can be very painful after rectal surgery.  Increasing fluid intake and taking a fiber supplement (such as Metamucil, Citrucel, FiberCon, etc) 1-2 times a day regularly will usually help prevent this problem from occurring.  A stool softener like colace is also recommended.  This can be purchased over the counter at your pharmacy.  You can take it up to 3 times a day.  If you do not have a bowel movement after 24 hrs since your surgery, take one does of milk of magnesia.  If you still haven't had a bowel movement 8-12 hours after that dose, take another dose.  If you don't have a bowel movement 48 hrs after surgery,  purchase a Fleets enema from the drug store and administer gently per package instructions.  If you still are having trouble with your bowel movements after that, please call the office for further instructions. If you develop diarrhea or have many loose bowel movements, simplify your diet to bland foods & liquids for a few days.  Stop any stool softeners and decrease your fiber supplement.  Switching to mild anti-diarrheal medications (Kayopectate, Pepto Bismol) can help.   If this worsens or does not improve, please call us .  Wound Care Remove your bandages before your first bowel movement or 8 hours after surgery.     Remove any wound packing material at this tim,e as well.  You do not need to repack the wound unless instructed otherwise.  Wear an absorbent pad or soft cotton gauze in your underwear to catch any drainage and help keep the area clean. You should change this every 2-3 hours while awake. Keep the area clean and dry.  Bathe / shower every day, especially after bowel movements.  Keep the area clean by showering / bathing over the incision / wound.   It is okay to soak an open wound to help wash it.  Wet wipes or showers / gentle washing after bowel movements is often less traumatic than regular toilet paper. You may have some styrofoam-like soft packing in the rectum which will come out with the first bowel movement.  You will often notice bleeding with bowel movements.  This should slow down by the end of the first week of surgery Expect some drainage.  This should slow down, too, by the end of the first week of surgery.  Wear an absorbent pad or soft cotton gauze in your underwear until the drainage stops. Do Not sit on a rubber or pillow ring.  This can make you symptoms worse.  You may sit on a soft pillow if needed.  ACTIVITIES as tolerated:   You may resume regular (light) daily activities beginning the next day--such as daily self-care, walking, climbing stairs--gradually increasing activities as tolerated.  If you can walk 30 minutes without difficulty, it is safe to try more intense activity such as jogging, treadmill, bicycling, low-impact aerobics, swimming, etc. Save the most intensive and strenuous activity for last such as sit-ups, heavy lifting, contact sports, etc  Refrain from any heavy lifting or straining until you are off narcotics for pain control.   You may drive when you are no longer taking prescription pain medication, you can  comfortably sit for long periods of time, and you can safely maneuver your car and apply brakes. You may have sexual intercourse when it is comfortable.  FOLLOW UP in our office Please call [facility] at [phone] to set up an appointment to see your surgeon in the office for a follow-up appointment approximately 3-4 weeks after your surgery. Make sure that you call for this appointment the day you arrive home to insure a convenient appointment time. 10. IF YOU HAVE DISABILITY OR FAMILY LEAVE FORMS, BRING THEM TO THE OFFICE FOR PROCESSING.  DO NOT GIVE THEM TO YOUR DOCTOR.     WHEN TO CALL US  [phone]: Poor pain control Reactions / problems with new medications (rash/itching, nausea, etc)  Fever over 101.5 F (38.5 C) Inability to urinate Nausea and/or vomiting Worsening swelling or bruising Continued bleeding from incision. Increased pain, redness, or drainage from the incision  The clinic staff is available to answer your questions during regular business hours (8:30am-5pm).  Please don't hesitate to call and ask to speak to one of our nurses for clinical concerns.   A surgeon from [facility] is always on call at the hospitals   If you have a medical emergency, go to the nearest emergency room or call 911.    [facility] [address], [place], [place]  [address] ? MAIN: [phone] ? TOLL FREE: [phone] ? FAX [phone] [email or web address]   Post Anesthesia Home Care Instructions  Activity: Get plenty of rest for the remainder of the day. A responsible individual must stay with you for 24 hours following the procedure.  For the next 24 hours, DO NOT: -Drive a car -Advertising copywriter -Drink alcoholic beverages -Take any medication unless instructed by your physician -Make any legal decisions or sign important papers.  Meals: Start with liquid foods such as gelatin or soup. Progress to regular foods  as tolerated. Avoid greasy, spicy, heavy foods. If nausea and/or vomiting occur, drink only clear liquids until the nausea and/or vomiting subsides. Call your physician if vomiting continues.  Special Instructions/Symptoms: Your throat may feel dry or sore from the anesthesia or the breathing tube placed in your throat during surgery. If this causes discomfort, gargle with warm salt water. The discomfort should disappear within 24 hours.  If you had a scopolamine patch placed behind your ear for the management of post- operative nausea and/or vomiting:  1. The medication in the patch is effective for 72 hours, after which it should be removed.  Wrap patch in a tissue and discard in the trash. Wash hands thoroughly with soap and water. 2. You may remove the patch earlier than 72 hours if you experience unpleasant side effects which may include dry mouth, dizziness or visual disturbances. 3. Avoid touching the patch. Wash your hands with soap and water after contact with the patch.   No tylenol until 4pm today.      No NSAIDS/ibuprofen until after 630pm

## 2024-06-24 NOTE — Interval H&P Note (Signed)
 History and Physical Interval Note:  06/24/2024 9:37 AM  Phillip [name]  has presented today for surgery, with the diagnosis of ANAL IRRITATION.  The various methods of treatment have been discussed with the patient and family. After consideration of risks, benefits and other options for treatment, the patient has consented to  Procedure(s) with comments: EXCISION, SKIN TAG (N/A) - EXCISION OF ANAL SKIN TAG EXAM UNDER ANESTHESIA, RECTUM (N/A) as a surgical intervention.  The patient's history has been reviewed, patient examined, no change in status, stable for surgery.  I have reviewed the patient's chart and labs.  Questions were answered to the patient's satisfaction.     Bernarda JAYSON Ned, MD  Colorectal and General Surgery [facility]

## 2024-06-24 NOTE — Anesthesia Preprocedure Evaluation (Signed)
 Anesthesia Evaluation  Patient identified by MRN, date of birth, ID band Patient awake    Reviewed: Allergy & Precautions, H&P , NPO status , Patient's Chart, lab work & pertinent test results  Airway Mallampati: II   Neck ROM: full    Dental   Pulmonary neg pulmonary ROS   breath sounds clear to auscultation       Cardiovascular negative cardio ROS  Rhythm:regular Rate:Normal     Neuro/Psych    GI/Hepatic hemorrhoids   Endo/Other    Renal/GU      Musculoskeletal   Abdominal   Peds  Hematology   Anesthesia Other Findings   Reproductive/Obstetrics                              Anesthesia Physical Anesthesia Plan  ASA: 1  Anesthesia Plan: MAC   Post-op Pain Management:    Induction: Intravenous  PONV Risk Score and Plan: 1 and Propofol  infusion, Midazolam  and Treatment may vary due to age or medical condition  Airway Management Planned: Simple Face Mask  Additional Equipment:   Intra-op Plan:   Post-operative Plan:   Informed Consent: I have reviewed the patients History and Physical, chart, labs and discussed the procedure including the risks, benefits and alternatives for the proposed anesthesia with the patient or authorized representative who has indicated his/her understanding and acceptance.     Dental advisory given  Plan Discussed with: CRNA, Anesthesiologist and Surgeon  Anesthesia Plan Comments:         Anesthesia Quick Evaluation

## 2024-06-24 NOTE — Op Note (Signed)
 06/24/2024  10:31 AM  PATIENT:  Phillip [name]  48 y.o. male  Patient Care Team: Lendia Boby CROME, NP-C as PCP - General (Family Medicine)  PRE-OPERATIVE DIAGNOSIS:  ANAL IRRITATION  POST-OPERATIVE DIAGNOSIS:  ANAL IRRITATION, ANAL FISSURE  PROCEDURE:  EXCISION, SKIN TAG EXAM UNDER ANESTHESIA, RECTUM    Surgeon(s): Debby Hila, MD  ASSISTANT: none   ANESTHESIA:   local and MAC  SPECIMEN:  Source of Specimen:  anal skin tag  DISPOSITION OF SPECIMEN:  PATHOLOGY  COUNTS:  YES  PLAN OF CARE: Discharge to home after PACU  PATIENT DISPOSITION:  PACU - hemodynamically stable.  INDICATION: 48 y.o. M with chronic anal irritation.  He has a L lateral chronically inflamed skin tag and a larger R lateral, non-inflamed skin tag    OR FINDINGS: Anal skin tag, perianal excoriation, anal fissure posterior midline without sphincter hypertension     DESCRIPTION: the patient was identified in the preoperative holding area and taken to the OR where they were laid on the operating room table.  MAC anesthesia was induced without difficulty. The patient was then positioned in prone jackknife position with buttocks gently taped apart.  The patient was then prepped and draped in usual sterile fashion.  SCDs were noted to be in place prior to the initiation of anesthesia. A surgical timeout was performed indicating the correct patient, procedure, positioning and need for preoperative antibiotics.  A rectal block was performed using Marcaine  with epinephrine .      I began with a digital rectal exam.  The anal canal was easily dilated.  There was a posterior midline shallow anal fissure noted.  There was excoriation noted throughout the perianal region.  I then placed a Hill-Ferguson anoscope into the anal canal and evaluated this completely.  There were grade 1 internal noninflamed hemorrhoids noted.  The left lateral skin tag appeared thickened and chronically inflamed.  This was excised using  Metzenbaum scissors and closed using a running 2-0 chromic suture.  This was sent to pathology for further examination.  The right lateral skin tag was soft and flat.  It did not appear to be the source of his inflammation.  A dressing was applied.  The patient was awakened from anesthesia and sent to the postanesthesia care unit in stable condition.  Hila JAYSON Debby, MD  Colorectal and General Surgery [facility]

## 2024-06-24 NOTE — Transfer of Care (Signed)
 Immediate Anesthesia Transfer of Care Note  Patient: Phillip [name]  Procedure(s) Performed: EXCISION, SKIN TAG (Rectum) EXAM UNDER ANESTHESIA, RECTUM (Rectum)  Patient Location: PACU  Anesthesia Type:MAC  Level of Consciousness: awake, alert , and oriented  Airway & Oxygen Therapy: Patient Spontanous Breathing and Patient connected to face mask oxygen  Post-op Assessment: Report given to RN and Post -op Vital signs reviewed and stable  Post vital signs: Reviewed and stable  Last Vitals:  Vitals Value Taken Time  BP 112/62 06/24/24 10:39  Temp    Pulse 93 06/24/24 10:40  Resp 19 06/24/24 10:40  SpO2 99 % 06/24/24 10:40  Vitals shown include unfiled device data.  Last Pain:  Vitals:   06/24/24 0953  TempSrc: Temporal  PainSc: 0-No pain      Patients Stated Pain Goal: 3 (06/24/24 0953)  Complications: No notable events documented.

## 2024-06-24 NOTE — Anesthesia Procedure Notes (Signed)
 Procedure Name: MAC Date/Time: 06/24/2024 10:10 AM  Performed by: Buster Catheryn SAUNDERS, CRNAPre-anesthesia Checklist: Patient identified, Emergency Drugs available, Suction available and Patient being monitored Patient Re-evaluated:Patient Re-evaluated prior to induction Oxygen Delivery Method: Nasal cannula Placement Confirmation: positive ETCO2

## 2024-06-24 NOTE — Anesthesia Postprocedure Evaluation (Signed)
 Anesthesia Post Note  Patient: Phillip [name]  Procedure(s) Performed: EXCISION, SKIN TAG (Rectum) EXAM UNDER ANESTHESIA, RECTUM (Rectum)     Patient location during evaluation: PACU Anesthesia Type: MAC Level of consciousness: awake and alert Pain management: pain level controlled Vital Signs Assessment: post-procedure vital signs reviewed and stable Respiratory status: spontaneous breathing, nonlabored ventilation, respiratory function stable and patient connected to nasal cannula oxygen Cardiovascular status: stable and blood pressure returned to baseline Postop Assessment: no apparent nausea or vomiting Anesthetic complications: no   No notable events documented.  Last Vitals:  Vitals:   06/24/24 1100 06/24/24 1121  BP: 105/67 104/66  Pulse: 88 81  Resp: (!) 21 18  Temp:  (!) 36.3 C  SpO2: 95% 98%    Last Pain:  Vitals:   06/24/24 1121  TempSrc:   PainSc: 0-No pain                 Jahzier Villalon S

## 2024-06-25 ENCOUNTER — Encounter (HOSPITAL_BASED_OUTPATIENT_CLINIC_OR_DEPARTMENT_OTHER): Payer: Self-pay | Admitting: General Surgery

## 2024-06-27 LAB — SURGICAL PATHOLOGY
